# Patient Record
Sex: Female | Born: 1980 | Race: White | Hispanic: No | Marital: Married | Smoking: Never smoker
Health system: Southern US, Community
[De-identification: ages and names within clinical notes are randomized; demographics above are authoritative.]

## PROBLEM LIST (undated history)

## (undated) ENCOUNTER — Inpatient Hospital Stay (HOSPITAL_COMMUNITY): Payer: Self-pay

## (undated) ENCOUNTER — Inpatient Hospital Stay (HOSPITAL_COMMUNITY): Payer: PRIVATE HEALTH INSURANCE

## (undated) DIAGNOSIS — G4730 Sleep apnea, unspecified: Secondary | ICD-10-CM

## (undated) DIAGNOSIS — F3281 Premenstrual dysphoric disorder: Secondary | ICD-10-CM

## (undated) HISTORY — PX: OTHER SURGICAL HISTORY: SHX169

## (undated) HISTORY — PX: EYE SURGERY: SHX253

## (undated) HISTORY — DX: Premenstrual dysphoric disorder: F32.81

## (undated) HISTORY — PX: UTERINE FIBROID SURGERY: SHX826

---

## 2012-12-21 ENCOUNTER — Encounter (HOSPITAL_COMMUNITY): Payer: Self-pay | Admitting: Emergency Medicine

## 2012-12-21 ENCOUNTER — Emergency Department (HOSPITAL_COMMUNITY)
Admission: EM | Admit: 2012-12-21 | Discharge: 2012-12-21 | Disposition: A | Discharge status: Home / Self Care | Payer: PRIVATE HEALTH INSURANCE | Attending: Emergency Medicine | Admitting: Emergency Medicine

## 2012-12-21 ENCOUNTER — Emergency Department (HOSPITAL_COMMUNITY): Payer: PRIVATE HEALTH INSURANCE

## 2012-12-21 DIAGNOSIS — R079 Chest pain, unspecified: Secondary | ICD-10-CM | POA: Insufficient documentation

## 2012-12-21 DIAGNOSIS — Z79899 Other long term (current) drug therapy: Secondary | ICD-10-CM | POA: Insufficient documentation

## 2012-12-21 DIAGNOSIS — IMO0002 Reserved for concepts with insufficient information to code with codable children: Secondary | ICD-10-CM | POA: Insufficient documentation

## 2012-12-21 LAB — CBC WITH DIFFERENTIAL/PLATELET
Basophils Absolute: 0 10*3/uL (ref 0.0–0.1)
Basophils Relative: 0 % (ref 0–1)
Eosinophils Relative: 2 % (ref 0–5)
Lymphocytes Relative: 39 % (ref 12–46)
MCH: 29.3 pg (ref 26.0–34.0)
Monocytes Relative: 7 % (ref 3–12)
Neutro Abs: 5 10*3/uL (ref 1.7–7.7)
Platelets: 258 10*3/uL (ref 150–400)
RBC: 4.34 MIL/uL (ref 3.87–5.11)
RDW: 12.9 % (ref 11.5–15.5)
WBC: 9.6 10*3/uL (ref 4.0–10.5)

## 2012-12-21 LAB — BASIC METABOLIC PANEL
BUN: 10 mg/dL (ref 6–23)
CO2: 22 mEq/L (ref 19–32)
Calcium: 9.2 mg/dL (ref 8.4–10.5)
Chloride: 104 mEq/L (ref 96–112)
GFR calc Af Amer: >90 mL/min (ref >90)
GFR calc non Af Amer: >90 mL/min (ref >90)
Sodium: 136 mEq/L (ref 135–145)

## 2012-12-21 LAB — POCT I-STAT TROPONIN I: Troponin i, poc: 0 ng/mL (ref 0.00–0.08)

## 2012-12-21 LAB — D-DIMER, QUANTITATIVE: D-Dimer, Quant: 0.37 ug/mL-FEU (ref 0.00–0.48)

## 2012-12-21 NOTE — ED Provider Notes (Signed)
CSN: [id number]     Arrival date & time 12/21/12  2154 History   First MD Initiated Contact with Patient 12/21/12 2219     Chief Complaint  Patient presents with  . Chest Pain   (Consider location/radiation/quality/duration/timing/severity/associated sxs/prior Treatment) Patient is a 32 y.o. female presenting with chest pain. The history is provided by the patient (the pt states she had some chest pain ). No language interpreter was used.  Chest Pain Pain location:  L chest Pain quality: aching   Pain radiates to:  Does not radiate Pain radiates to the back: no   Pain severity:  Mild Onset quality:  Sudden Progression:  Waxing and waning Associated symptoms: no abdominal pain, no back pain, no cough, no fatigue and no headache     History reviewed. No pertinent past medical history. History reviewed. No pertinent past surgical history. No family history on file. History  Substance Use Topics  . Smoking status: Never Smoker   . Smokeless tobacco: Not on file  . Alcohol Use: Yes     Comment: occasionally   OB History   Grav Para Term Preterm Abortions TAB SAB Ect Mult Living                 Review of Systems  Constitutional: Negative for appetite change and fatigue.  HENT: Negative for congestion, ear discharge and sinus pressure.   Eyes: Negative for discharge.  Respiratory: Negative for cough.   Cardiovascular: Positive for chest pain.  Gastrointestinal: Negative for abdominal pain and diarrhea.  Genitourinary: Negative for frequency and hematuria.  Musculoskeletal: Negative for back pain.  Skin: Negative for rash.  Neurological: Negative for seizures and headaches.  Psychiatric/Behavioral: Negative for hallucinations.    Allergies  Review of patient's allergies indicates not on file.  Home Medications   Current Outpatient Rx  Name  Route  Sig  Dispense  Refill  . buPROPion (WELLBUTRIN XL) 300 MG 24 hr tablet   Oral   Take 300 mg by mouth daily.          . fluticasone (FLONASE) 50 MCG/ACT nasal spray                BP 129/76  Temp(Src) 97.9 F (36.6 C) (Oral)  Resp 27  SpO2 100%  LMP 12/18/2012 Physical Exam  Constitutional: She is oriented to person, place, and time. She appears well-developed.  HENT:  Head: Normocephalic.  Eyes: Conjunctivae and EOM are normal. No scleral icterus.  Neck: Neck supple. No thyromegaly present.  Cardiovascular: Normal rate and regular rhythm.  Exam reveals no gallop and no friction rub.   No murmur heard. Pulmonary/Chest: No stridor. She has no wheezes. She has no rales. She exhibits no tenderness.  Abdominal: She exhibits no distension. There is no tenderness. There is no rebound.  Musculoskeletal: Normal range of motion. She exhibits no edema.  Lymphadenopathy:    She has no cervical adenopathy.  Neurological: She is oriented to person, place, and time. She exhibits normal muscle tone. Coordination normal.  Skin: No rash noted. No erythema.  Psychiatric: She has a normal mood and affect. Her behavior is normal.    ED Course  Procedures (including critical care time) Labs Review Labs Reviewed  BASIC METABOLIC PANEL - Abnormal; Notable for the following:    Glucose, Bld 121 (*)    All other components within normal limits  CBC WITH DIFFERENTIAL  D-DIMER, QUANTITATIVE  POCT I-STAT TROPONIN I   Imaging Review Dg Chest 2 View  12/21/2012   CLINICAL DATA:  Chest pain.  EXAM: CHEST  2 VIEW  COMPARISON:  None.  FINDINGS: The heart size and mediastinal contours are within normal limits. Both lungs are clear. The visualized skeletal structures are unremarkable.  IMPRESSION: Normal examination.   Electronically Signed   By: Gordan Payment M.D.   On: 12/21/2012 22:55    EKG Interpretation     Ventricular Rate:  83 PR Interval:  149 QRS Duration: 82 QT Interval:  376 QTC Calculation: 442 R Axis:   60 Text Interpretation:  Age not entered, assumed to be  32 years old for purpose of ECG  interpretation Sinus rhythm Consider left atrial enlargement            MDM   1. Chest pain        Benny Lennert, MD 12/21/12 [phone]

## 2012-12-21 NOTE — ED Notes (Signed)
Pt states that she has had chest pain and shortness of breath of 2 days; pt was seen at the urgent care in [place] today and they called her this evening and advised to go to ER for evaluation of PE due to elevated blood work; pt is unsure what labs was elevated

## 2015-06-14 IMAGING — CR DG CHEST 2V
2 series · 2 of 2 positions shown · non-contrast
Comparison: None.

CLINICAL DATA: Chest pain.

EXAM:
CHEST  2 VIEW

[w chest pa]
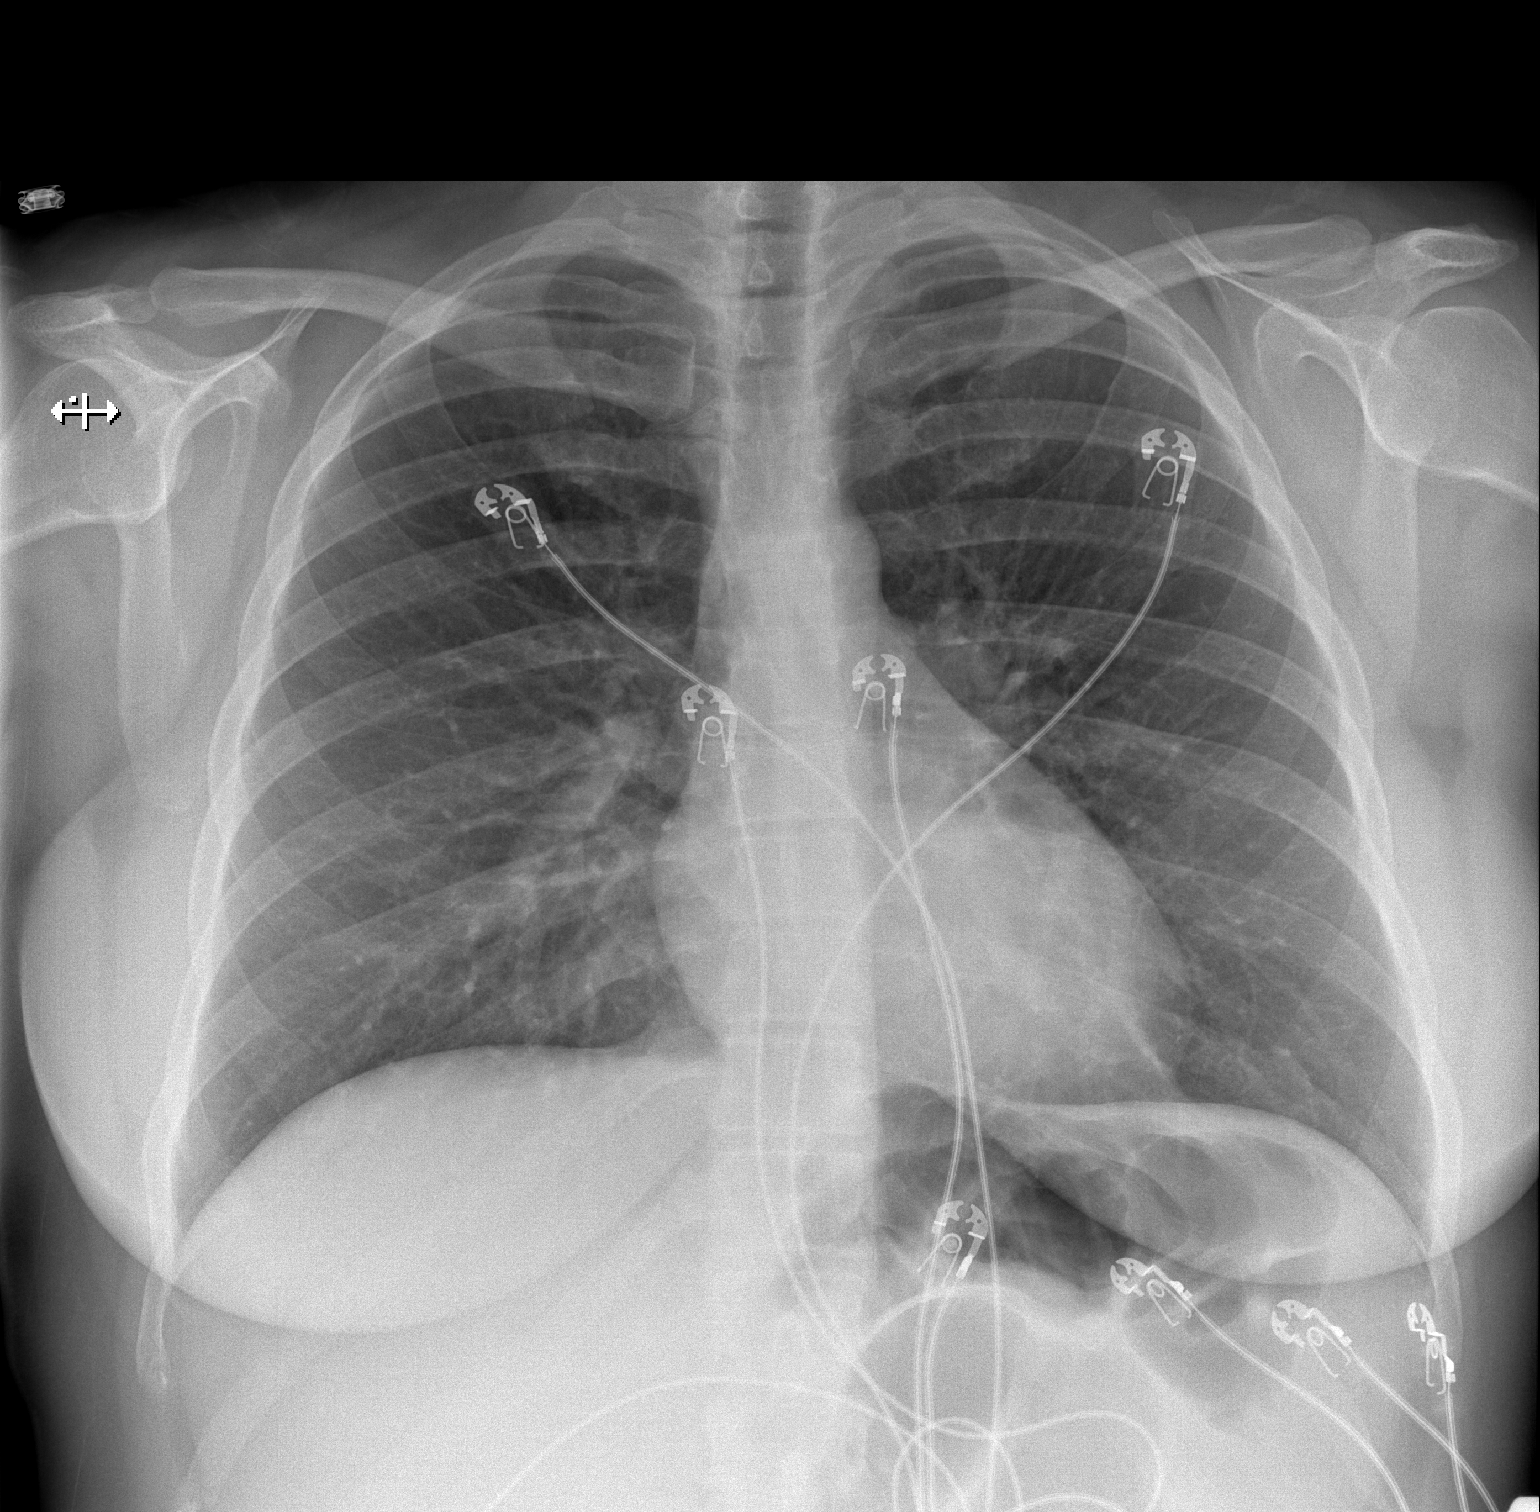

[w chest lat]
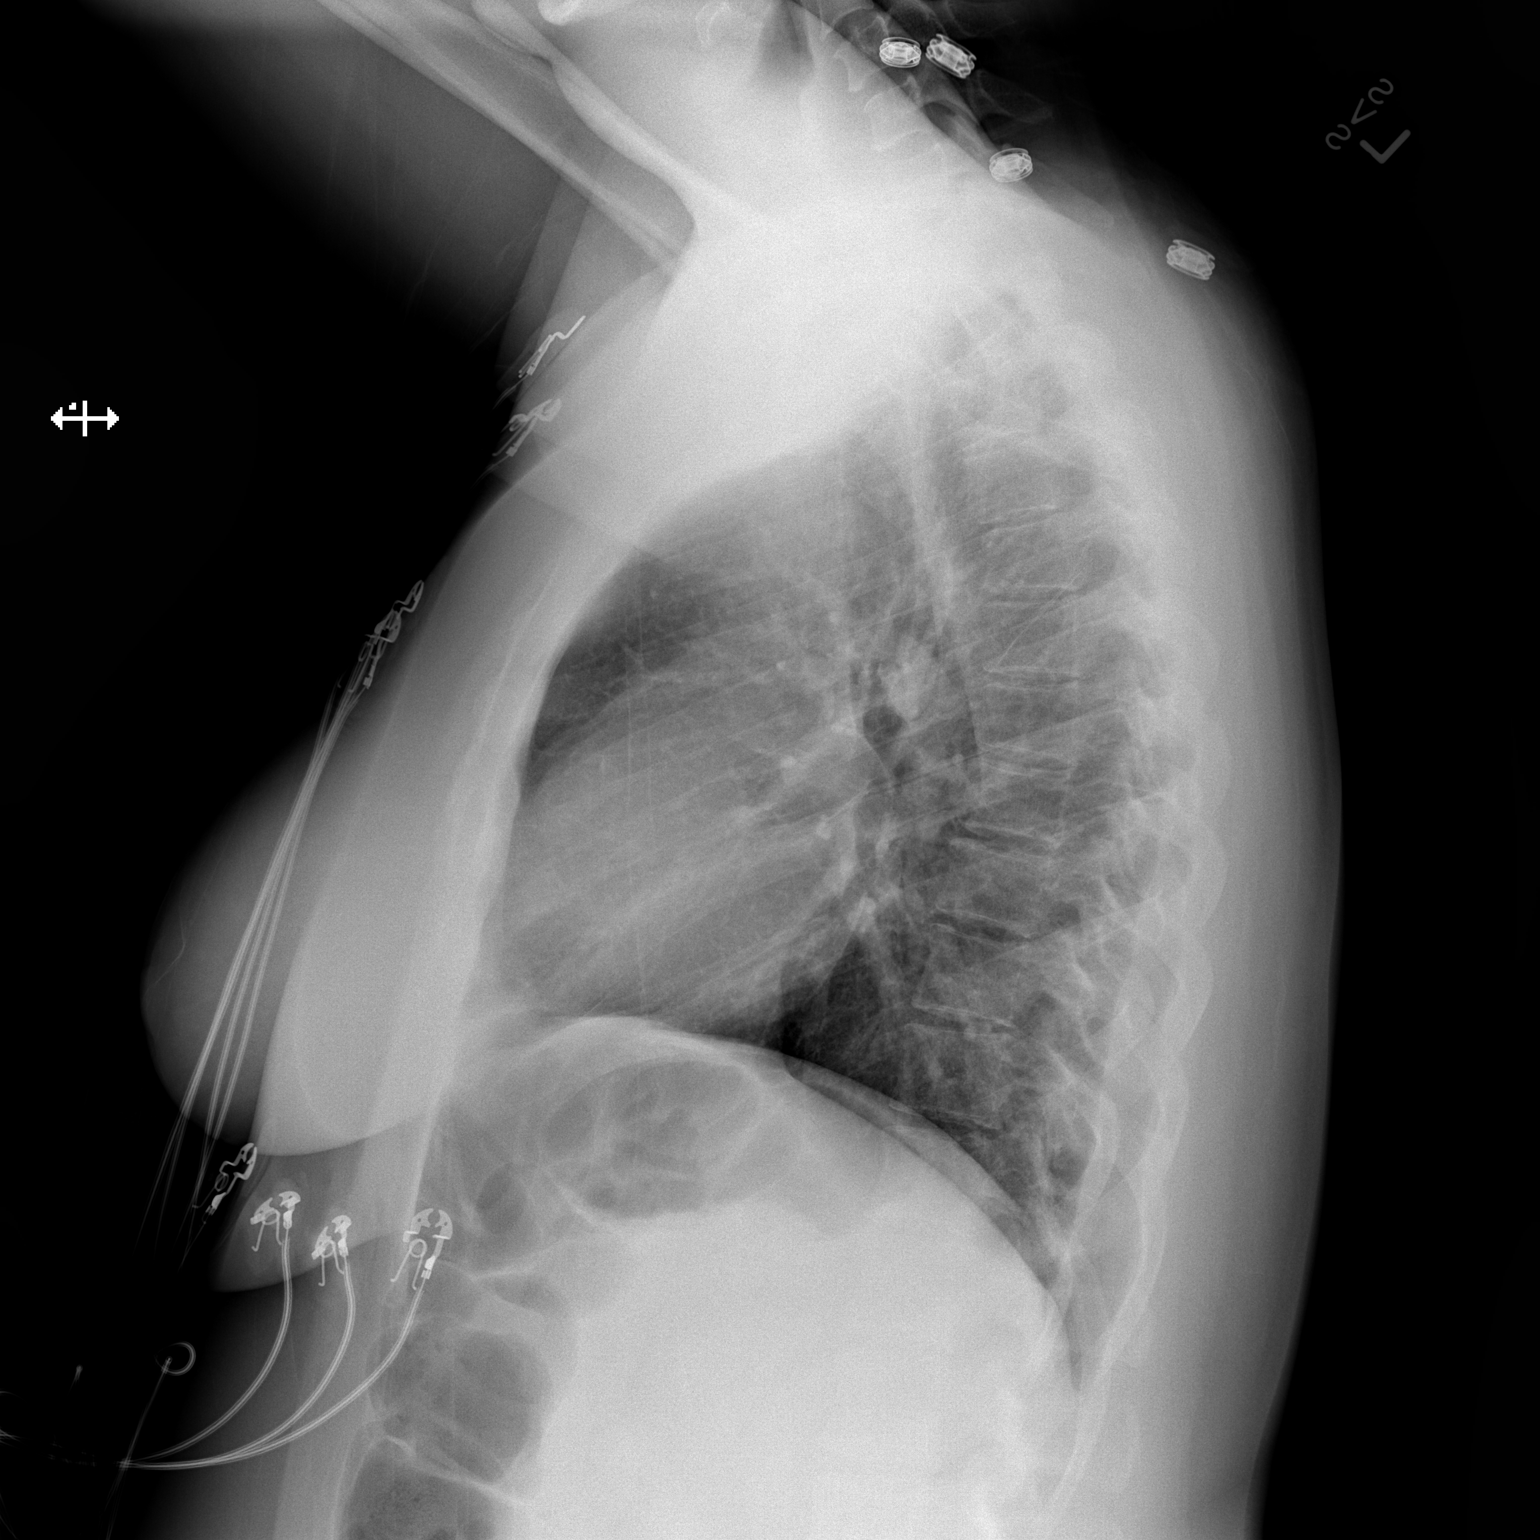

[2 of 2 positions shown; findings below may reference images not displayed]

FINDINGS: The heart size and mediastinal contours are within normal limits.
Both lungs are clear. The visualized skeletal structures are
unremarkable.
IMPRESSION: Normal examination.

## 2016-06-18 LAB — OB RESULTS CONSOLE HIV ANTIBODY (ROUTINE TESTING): HIV: NONREACTIVE

## 2016-06-18 LAB — OB RESULTS CONSOLE GC/CHLAMYDIA
Chlamydia: NEGATIVE
Gonorrhea: NEGATIVE

## 2016-06-18 LAB — OB RESULTS CONSOLE RPR: RPR: NONREACTIVE

## 2016-06-18 LAB — OB RESULTS CONSOLE HEPATITIS B SURFACE ANTIGEN: HEP B S AG: NEGATIVE

## 2016-06-18 LAB — OB RESULTS CONSOLE ABO/RH: RH TYPE: POSITIVE

## 2016-06-18 LAB — OB RESULTS CONSOLE RUBELLA ANTIBODY, IGM: Rubella: IMMUNE

## 2016-06-18 LAB — OB RESULTS CONSOLE ANTIBODY SCREEN: Antibody Screen: NEGATIVE

## 2016-11-13 ENCOUNTER — Encounter: Payer: PRIVATE HEALTH INSURANCE | Attending: Obstetrics and Gynecology | Admitting: Registered"

## 2016-11-13 DIAGNOSIS — R7309 Other abnormal glucose: Secondary | ICD-10-CM

## 2016-11-13 DIAGNOSIS — O99810 Abnormal glucose complicating pregnancy: Secondary | ICD-10-CM | POA: Insufficient documentation

## 2016-11-13 DIAGNOSIS — Z3A00 Weeks of gestation of pregnancy not specified: Secondary | ICD-10-CM | POA: Insufficient documentation

## 2016-11-14 NOTE — Progress Notes (Signed)
Patient was seen on 11/13/2016 for Gestational Diabetes self-management class at the [facility]. The following learning objectives were met by the patient during this course:   States the definition of Gestational Diabetes  States why dietary management is important in controlling blood glucose  Describes the effects each nutrient has on blood glucose levels  Demonstrates ability to create a balanced meal plan  Demonstrates carbohydrate counting   States when to check blood glucose levels  Demonstrates proper blood glucose monitoring techniques  States the effect of stress and exercise on blood glucose levels  States the importance of limiting caffeine and abstaining from alcohol and smoking  Blood glucose monitor given: Contour Next Lot # [id number] Exp: 11/10/2016 (date on box expired, but gave to pt because test strip box expiration is 02/10/2017)   Blood glucose reading: 91  Patient instructed to monitor glucose levels: FBS: 60 - <95 1 hour: <140 2 hour: <120  Patient received handouts:  Nutrition Diabetes and Pregnancy  Carbohydrate Counting List  Patient will be seen for follow-up as needed.

## 2016-11-18 ENCOUNTER — Encounter: Payer: Self-pay | Admitting: Registered"

## 2016-11-18 DIAGNOSIS — R7309 Other abnormal glucose: Secondary | ICD-10-CM | POA: Insufficient documentation

## 2016-11-27 LAB — OB RESULTS CONSOLE GBS: STREP GROUP B AG: POSITIVE

## 2016-12-20 ENCOUNTER — Encounter (HOSPITAL_COMMUNITY): Payer: Self-pay | Admitting: *Deleted

## 2016-12-20 ENCOUNTER — Telehealth (HOSPITAL_COMMUNITY): Payer: Self-pay | Admitting: *Deleted

## 2016-12-20 ENCOUNTER — Inpatient Hospital Stay (HOSPITAL_COMMUNITY)
Admission: AD | Admit: 2016-12-20 | Discharge: 2016-12-20 | Disposition: A | Discharge status: Home / Self Care | Payer: PRIVATE HEALTH INSURANCE | Source: Ambulatory Visit | Attending: Obstetrics and Gynecology | Admitting: Obstetrics and Gynecology

## 2016-12-20 ENCOUNTER — Encounter (HOSPITAL_COMMUNITY): Payer: Self-pay

## 2016-12-20 DIAGNOSIS — O133 Gestational [pregnancy-induced] hypertension without significant proteinuria, third trimester: Secondary | ICD-10-CM | POA: Insufficient documentation

## 2016-12-20 DIAGNOSIS — Z3A38 38 weeks gestation of pregnancy: Secondary | ICD-10-CM | POA: Diagnosis not present

## 2016-12-20 DIAGNOSIS — O139 Gestational [pregnancy-induced] hypertension without significant proteinuria, unspecified trimester: Secondary | ICD-10-CM | POA: Diagnosis present

## 2016-12-20 HISTORY — DX: Sleep apnea, unspecified: G47.30

## 2016-12-20 LAB — CBC
HCT: 33.1 % — ABNORMAL LOW (ref 36.0–46.0)
Hemoglobin: 11.4 g/dL — ABNORMAL LOW (ref 12.0–15.0)
MCH: 28.7 pg (ref 26.0–34.0)
MCHC: 34.4 g/dL (ref 30.0–36.0)
MCV: 83.4 fL (ref 78.0–100.0)
PLATELETS: 201 10*3/uL (ref 150–400)
RBC: 3.97 MIL/uL (ref 3.87–5.11)
RDW: 14.8 % (ref 11.5–15.5)
WBC: 9.6 10*3/uL (ref 4.0–10.5)

## 2016-12-20 LAB — PROTEIN / CREATININE RATIO, URINE
CREATININE, URINE: 54 mg/dL
PROTEIN CREATININE RATIO: 0.28 mg/mg{creat} — AB (ref 0.00–0.15)
TOTAL PROTEIN, URINE: 15 mg/dL

## 2016-12-20 LAB — URINALYSIS, ROUTINE W REFLEX MICROSCOPIC
BILIRUBIN URINE: NEGATIVE
Glucose, UA: NEGATIVE mg/dL
Hgb urine dipstick: NEGATIVE
Ketones, ur: NEGATIVE mg/dL
Leukocytes, UA: NEGATIVE
Nitrite: NEGATIVE
PH: 6 (ref 5.0–8.0)
Protein, ur: NEGATIVE mg/dL
SPECIFIC GRAVITY, URINE: 1.008 (ref 1.005–1.030)

## 2016-12-20 LAB — COMPREHENSIVE METABOLIC PANEL
ALT: 12 U/L — ABNORMAL LOW (ref 14–54)
ANION GAP: 8 (ref 5–15)
AST: 15 U/L (ref 15–41)
Albumin: 2.6 g/dL — ABNORMAL LOW (ref 3.5–5.0)
Alkaline Phosphatase: 150 U/L — ABNORMAL HIGH (ref 38–126)
BUN: 15 mg/dL (ref 6–20)
CHLORIDE: 108 mmol/L (ref 101–111)
CO2: 20 mmol/L — ABNORMAL LOW (ref 22–32)
Calcium: 8.5 mg/dL — ABNORMAL LOW (ref 8.9–10.3)
Creatinine, Ser: 0.77 mg/dL (ref 0.44–1.00)
Glucose, Bld: 76 mg/dL (ref 65–99)
POTASSIUM: 4 mmol/L (ref 3.5–5.1)
Sodium: 136 mmol/L (ref 135–145)
Total Bilirubin: 0.5 mg/dL (ref 0.3–1.2)
Total Protein: 5.6 g/dL — ABNORMAL LOW (ref 6.5–8.1)

## 2016-12-20 NOTE — Telephone Encounter (Signed)
Preadmission screen  

## 2016-12-20 NOTE — MAU Provider Note (Signed)
History     CSN: [id number]  Arrival date and time: 12/20/16 1527   First Provider Initiated Contact with Patient 12/20/16 1638      Chief Complaint  Patient presents with  . Hypertension   HPI  Ms. Thornton ParkKristen Chamberlain is a 36 y.o. G1P0 at 436w6d gestation sent from her OB office for PEC w/u. She had elevated BPs in the office this week -- normal labs in the office this week. NST in the office today.   Past Medical History:  Diagnosis Date  . PMDD (premenstrual dysphoric disorder)     Past Surgical History:  Procedure Laterality Date  . chin surgery    . EYE SURGERY    . UTERINE FIBROID SURGERY      Family History  Problem Relation Age of Onset  . Cancer Mother   . Hypertension Brother   . Diabetes Maternal Grandmother   . Hypertension Maternal Grandfather     Social History   Tobacco Use  . Smoking status: Never Smoker  . Smokeless tobacco: Never Used  Substance Use Topics  . Alcohol use: No    Frequency: Never    Comment: occasionally  . Drug use: No    Allergies:  Allergies  Allergen Reactions  . Sulfa Antibiotics Swelling    Of the lips    Medications Prior to Admission  Medication Sig Dispense Refill Last Dose  . acetaminophen (TYLENOL) 500 MG tablet Take 500 mg every 6 (six) hours as needed by mouth for moderate pain.   12/19/2016 at Unknown time  . buPROPion (WELLBUTRIN XL) 300 MG 24 hr tablet Take 300 mg by mouth daily.   12/20/2016 at Unknown time  . calcium carbonate (TUMS - DOSED IN MG ELEMENTAL CALCIUM) 500 MG chewable tablet Chew 2 tablets 3 (three) times daily as needed by mouth for indigestion or heartburn.   12/20/2016 at Unknown time  . calcium-vitamin D (CALCIUM 500/D) 500-200 MG-UNIT tablet Take 1 tablet daily with breakfast by mouth. Pt stopped taking two weeks ago. 12/06/16   12/06/2016  . fluticasone (FLONASE) 50 MCG/ACT nasal spray Place 2 sprays daily into both nostrils.    12/20/2016 at Unknown time  . Magnesium 400 MG CAPS Take 1  capsule daily by mouth. Pt stopped taking two weeks ago. 12/06/16   12/06/2016  . Prenatal Vit-Fe Fumarate-FA (PRENATAL MULTIVITAMIN) TABS tablet Take 1 tablet at bedtime by mouth.   12/19/2016    Review of Systems  Constitutional: Negative.   HENT: Negative.   Eyes: Negative.   Respiratory: Negative.   Cardiovascular: Positive for leg swelling (bilateral -- not new issue).  Gastrointestinal: Negative.   Endocrine: Negative.   Genitourinary: Negative.   Musculoskeletal: Negative.   Skin: Negative.   Allergic/Immunologic: Negative.   Neurological: Negative.   Hematological: Negative.   Psychiatric/Behavioral: Negative.    Physical Exam   Blood pressure (!) 134/91, pulse 68, temperature 98.4 F (36.9 C), temperature source Oral, resp. rate 20.   Patient Vitals for the past 24 hrs:  BP Temp Temp src Pulse Resp  12/20/16 1631 (!) 134/91 - - 68 -  12/20/16 1616 136/89 - - 79 -  12/20/16 1601 (!) 143/92 - - 85 -  12/20/16 1550 (!) 143/64 98.4 F (36.9 C) Oral 93 20    Physical Exam  Nursing note and vitals reviewed. Constitutional: She is oriented to person, place, and time. She appears well-developed and well-nourished.  HENT:  Head: Normocephalic.  Eyes: Pupils are equal, round, and reactive to  light.  Neck: Normal range of motion.  Cardiovascular: Normal rate, regular rhythm, normal heart sounds and intact distal pulses.  Respiratory: Effort normal and breath sounds normal.  GI: Soft. Bowel sounds are normal.  Genitourinary:  Genitourinary Comments: Pelvic deferred  Musculoskeletal: Normal range of motion.  Neurological: She is alert and oriented to person, place, and time. She has normal reflexes.  Skin: Skin is warm and dry.  Psychiatric: She has a normal mood and affect. Her behavior is normal. Judgment and thought content normal.    MAU Course  Procedures  MDM CCUA CBC CMP P/C ratio NST - FHR: 130 bpm / moderate variability / accels present / decels absent /  TOCO: none  *Consult with Dr. Jackelyn KnifeMeisinger @ 1745 - notified of patient's complaints, assessments, lab & U/S results, recommended tx plan d/c home with no antihypertensive med and f/u in office as scheduled   Assessment and Plan  Gestational hypertension, third trimester - Discharge home with information on gHTN and PEC - S/S reviewed to return to MAU or OB office - Advised to stay well-hydrated, rest with BLE elevated and rest for the weekend  Patient verbalized an understanding of the plan of care and agrees.   Raelyn Moraolitta Jamiaya Bina, MSN, CNM 12/20/2016, 4:44 PM

## 2016-12-20 NOTE — MAU Note (Signed)
Pt reports Dr Jackelyn KnifeMeisinger sent her over from the office due to elevated BP. Reports it has been elevated the past couple of days. No headache, no blurry vision. No vaginal bleeding, no LOF. +fetal movement

## 2016-12-24 MED ORDER — HYDRALAZINE HCL 20 MG/ML IJ SOLN
10.0000 mg | Freq: Once | INTRAMUSCULAR | Status: DC | PRN
  Filled 2016-12-24: qty 0.5

## 2016-12-24 MED ORDER — LABETALOL HCL 5 MG/ML IV SOLN
20.0000 mg | INTRAVENOUS | Status: DC | PRN
  Administered 2016-12-26: 20 mg via INTRAVENOUS
  Filled 2016-12-24: qty 16
  Filled 2016-12-24: qty 4

## 2016-12-24 NOTE — H&P (Signed)
Brittany ParkKristen Werner is a 36 y.o. female G1P0 at 2739 4/7 weeks (EDD 12/28/16 by known date of conception with a Day 5 transfer of embryo from donor agg and sperm)  presenting for IOL for gestational hypertension with increasing BP since about [redacted] weeks gestation.  No symptoms, lab abnormalities, or proteinuria.  She is GBS positive. Prenatal care also complicated by gestational diabetes--diet controlled and diagnosed at 33 weeks on repeat 3 hour GTT.  She is AMA but had donor egg and sperm for embryo that was pretested and 46XX.  She has a history of depressive disorder but is stable on Wellbutrin.   OB History    Gravida Para Term Preterm AB Living   1             SAB TAB Ectopic Multiple Live Births                 Past Medical History:  Diagnosis Date  . PMDD (premenstrual dysphoric disorder)   . Sleep apnea    Past Surgical History:  Procedure Laterality Date  . chin surgery    . EYE SURGERY    . UTERINE FIBROID SURGERY     Family History: family history includes Cancer in her mother; Diabetes in her maternal grandmother; Hypertension in her brother and maternal grandfather. Social History:  reports that  has never smoked. she has never used smokeless tobacco. She reports that she does not drink alcohol or use drugs.     Maternal Diabetes: Yes:  Diabetes Type:  Diet controlled Genetic Screening: Normal Maternal Ultrasounds/Referrals: Normal Fetal Ultrasounds or other Referrals:  None Maternal Substance Abuse:  No Significant Maternal Medications:  Meds include: Other:  wellbutrin Significant Maternal Lab Results:  None Other Comments:  None  Review of Systems  Gastrointestinal: Negative for abdominal pain.  Neurological: Negative for headaches.   Maternal Medical History:  Contractions: Frequency: irregular.   Perceived severity is mild.    Fetal activity: Perceived fetal activity is normal.    Prenatal complications: PIH.   Prenatal Complications - Diabetes:  gestational. Diabetes is managed by diet.        There were no vitals taken for this visit. Maternal Exam:  Uterine Assessment: Contraction strength is mild.  Contraction frequency is irregular.   Abdomen: Patient reports no abdominal tenderness. Fetal presentation: vertex  Introitus: Normal vulva. Normal vagina.    Physical Exam  Cardiovascular: Normal rate and regular rhythm.  Respiratory: Effort normal.  GI: Soft.  Genitourinary: Vagina normal.  Musculoskeletal: She exhibits edema.  Neurological: She is alert.  Psychiatric: She has a normal mood and affect.    Prenatal labs: ABO, Rh: O/Positive/-- (05/08 0000) Antibody: Negative (05/08 0000) Rubella: Immune (05/08 0000) RPR: Nonreactive (05/08 0000)  HBsAg: Negative (05/08 0000)  HIV: Non-reactive (05/08 0000)  GBS: Positive (10/17 0000)  One hour GCT 138/3 hour  GTT abnormal at 32 weeks Normal genetics on embryo prior to transfer  Assessment/Plan: Pt is admitted for IOL for gestational hypertension.  Labs 4 days ago were WNL and will be repeated on admission.  Plan cytotec ripening and then AROM and pitocin when able.  Treat BP as needed with protocol.  Consider magnesium only if severe parameters develop. PCN for +GBS.     Oliver PilaKathy Werner Brittany Werner 12/24/2016, 1:33 PM

## 2016-12-25 ENCOUNTER — Inpatient Hospital Stay (HOSPITAL_COMMUNITY)
Admission: RE | Admit: 2016-12-25 | Discharge: 2016-12-29 | DRG: 788 | Disposition: A | Discharge status: Home / Self Care | Payer: PRIVATE HEALTH INSURANCE | Source: Ambulatory Visit | Attending: Obstetrics and Gynecology | Admitting: Obstetrics and Gynecology

## 2016-12-25 ENCOUNTER — Encounter (HOSPITAL_COMMUNITY): Payer: Self-pay | Admitting: Anesthesiology

## 2016-12-25 ENCOUNTER — Inpatient Hospital Stay (HOSPITAL_COMMUNITY): Payer: PRIVATE HEALTH INSURANCE | Admitting: Anesthesiology

## 2016-12-25 ENCOUNTER — Encounter (HOSPITAL_COMMUNITY): Payer: Self-pay

## 2016-12-25 ENCOUNTER — Other Ambulatory Visit: Payer: Self-pay

## 2016-12-25 DIAGNOSIS — F329 Major depressive disorder, single episode, unspecified: Secondary | ICD-10-CM | POA: Diagnosis present

## 2016-12-25 DIAGNOSIS — O99824 Streptococcus B carrier state complicating childbirth: Secondary | ICD-10-CM | POA: Diagnosis present

## 2016-12-25 DIAGNOSIS — O99214 Obesity complicating childbirth: Secondary | ICD-10-CM | POA: Diagnosis present

## 2016-12-25 DIAGNOSIS — O24420 Gestational diabetes mellitus in childbirth, diet controlled: Secondary | ICD-10-CM | POA: Diagnosis present

## 2016-12-25 DIAGNOSIS — Z3A39 39 weeks gestation of pregnancy: Secondary | ICD-10-CM | POA: Diagnosis not present

## 2016-12-25 DIAGNOSIS — O133 Gestational [pregnancy-induced] hypertension without significant proteinuria, third trimester: Secondary | ICD-10-CM | POA: Diagnosis present

## 2016-12-25 DIAGNOSIS — O99344 Other mental disorders complicating childbirth: Secondary | ICD-10-CM | POA: Diagnosis present

## 2016-12-25 DIAGNOSIS — O134 Gestational [pregnancy-induced] hypertension without significant proteinuria, complicating childbirth: Principal | ICD-10-CM | POA: Diagnosis present

## 2016-12-25 LAB — CBC
HCT: 34.3 % — ABNORMAL LOW (ref 36.0–46.0)
HCT: 33.7 % — ABNORMAL LOW (ref 36.0–46.0)
HCT: 36.4 % (ref 36.0–46.0)
Hemoglobin: 11.6 g/dL — ABNORMAL LOW (ref 12.0–15.0)
Hemoglobin: 11.2 g/dL — ABNORMAL LOW (ref 12.0–15.0)
Hemoglobin: 12 g/dL (ref 12.0–15.0)
MCH: 28.9 pg (ref 26.0–34.0)
MCH: 28.3 pg (ref 26.0–34.0)
MCH: 28.6 pg (ref 26.0–34.0)
MCHC: 33.8 g/dL (ref 30.0–36.0)
MCHC: 33.2 g/dL (ref 30.0–36.0)
MCHC: 33 g/dL (ref 30.0–36.0)
MCV: 85.3 fL (ref 78.0–100.0)
MCV: 85.1 fL (ref 78.0–100.0)
MCV: 86.9 fL (ref 78.0–100.0)
PLATELETS: 249 10*3/uL (ref 150–400)
PLATELETS: 263 10*3/uL (ref 150–400)
PLATELETS: 235 10*3/uL (ref 150–400)
RBC: 4.02 MIL/uL (ref 3.87–5.11)
RBC: 3.96 MIL/uL (ref 3.87–5.11)
RBC: 4.19 MIL/uL (ref 3.87–5.11)
RDW: 14.6 % (ref 11.5–15.5)
RDW: 14.8 % (ref 11.5–15.5)
RDW: 14.6 % (ref 11.5–15.5)
WBC: 10.2 10*3/uL (ref 4.0–10.5)
WBC: 10.1 10*3/uL (ref 4.0–10.5)
WBC: 12.8 10*3/uL — ABNORMAL HIGH (ref 4.0–10.5)

## 2016-12-25 LAB — PROTEIN / CREATININE RATIO, URINE
CREATININE, URINE: 239 mg/dL
Protein Creatinine Ratio: 0.24 mg/mg{Cre} — ABNORMAL HIGH (ref 0.00–0.15)
Total Protein, Urine: 58 mg/dL

## 2016-12-25 LAB — COMPREHENSIVE METABOLIC PANEL
ALBUMIN: 2.7 g/dL — AB (ref 3.5–5.0)
ALK PHOS: 160 U/L — AB (ref 38–126)
ALT: 12 U/L — ABNORMAL LOW (ref 14–54)
ANION GAP: 7 (ref 5–15)
AST: 14 U/L — ABNORMAL LOW (ref 15–41)
BILIRUBIN TOTAL: 0.3 mg/dL (ref 0.3–1.2)
BUN: 21 mg/dL — AB (ref 6–20)
CALCIUM: 8.8 mg/dL — AB (ref 8.9–10.3)
CO2: 20 mmol/L — AB (ref 22–32)
CREATININE: 0.79 mg/dL (ref 0.44–1.00)
Chloride: 109 mmol/L (ref 101–111)
GFR calc Af Amer: >60 mL/min (ref >60)
GFR calc non Af Amer: >60 mL/min (ref >60)
GLUCOSE: 103 mg/dL — AB (ref 65–99)
Potassium: 3.9 mmol/L (ref 3.5–5.1)
SODIUM: 136 mmol/L (ref 135–145)
TOTAL PROTEIN: 5.5 g/dL — AB (ref 6.5–8.1)

## 2016-12-25 LAB — TYPE AND SCREEN
ABO/RH(D): O POS
Antibody Screen: NEGATIVE

## 2016-12-25 LAB — ABO/RH: ABO/RH(D): O POS

## 2016-12-25 LAB — RPR: RPR: NONREACTIVE

## 2016-12-25 MED ORDER — PHENYLEPHRINE 40 MCG/ML (10ML) SYRINGE FOR IV PUSH (FOR BLOOD PRESSURE SUPPORT)
80.0000 ug | PREFILLED_SYRINGE | INTRAVENOUS | Status: DC | PRN
  Filled 2016-12-25: qty 10

## 2016-12-25 MED ORDER — LACTATED RINGERS IV SOLN: 500.0000 mL | INTRAVENOUS | Status: DC | PRN

## 2016-12-25 MED ORDER — OXYTOCIN 40 UNITS IN LACTATED RINGERS INFUSION - SIMPLE MED
1.0000 m[IU]/min | INTRAVENOUS | Status: DC
  Administered 2016-12-25: 2 m[IU]/min via INTRAVENOUS
  Administered 2016-12-26: 12 m[IU]/min via INTRAVENOUS
  Administered 2016-12-26: 14 m[IU]/min via INTRAVENOUS
  Administered 2016-12-26: 4 m[IU]/min via INTRAVENOUS
  Administered 2016-12-26: 8 m[IU]/min via INTRAVENOUS
  Administered 2016-12-26: 2 m[IU]/min via INTRAVENOUS
  Administered 2016-12-26: 30 m[IU]/min via INTRAVENOUS
  Administered 2016-12-26: 22 m[IU]/min via INTRAVENOUS
  Filled 2016-12-25 (×2): qty 1000

## 2016-12-25 MED ORDER — FENTANYL 2.5 MCG/ML BUPIVACAINE 1/10 % EPIDURAL INFUSION (WH - ANES)
14.0000 mL/h | INTRAMUSCULAR | Status: DC | PRN
Start: 2016-12-25 — End: 2016-12-26
  Administered 2016-12-25 – 2016-12-26 (×4): 14 mL/h via EPIDURAL
  Filled 2016-12-25 (×4): qty 100

## 2016-12-25 MED ORDER — OXYCODONE-ACETAMINOPHEN 5-325 MG PO TABS: 1.0000 | ORAL_TABLET | ORAL | Status: DC | PRN

## 2016-12-25 MED ORDER — MISOPROSTOL 25 MCG QUARTER TABLET
25.0000 ug | ORAL_TABLET | ORAL | Status: AC | PRN
  Administered 2016-12-25 (×2): 25 ug via VAGINAL
  Filled 2016-12-25 (×2): qty 1

## 2016-12-25 MED ORDER — ACETAMINOPHEN 325 MG PO TABS
650.0000 mg | ORAL_TABLET | ORAL | Status: DC | PRN
Start: 2016-12-25 — End: 2016-12-26

## 2016-12-25 MED ORDER — LIDOCAINE HCL (PF) 1 % IJ SOLN: 30.0000 mL | INTRAMUSCULAR | Status: DC | PRN

## 2016-12-25 MED ORDER — ONDANSETRON HCL 4 MG/2ML IJ SOLN: 4.0000 mg | Freq: Four times a day (QID) | INTRAMUSCULAR | Status: DC | PRN

## 2016-12-25 MED ORDER — TERBUTALINE SULFATE 1 MG/ML IJ SOLN: 0.2500 mg | Freq: Once | INTRAMUSCULAR | Status: DC | PRN

## 2016-12-25 MED ORDER — EPHEDRINE 5 MG/ML INJ: 10.0000 mg | INTRAVENOUS | Status: DC | PRN

## 2016-12-25 MED ORDER — DIPHENHYDRAMINE HCL 50 MG/ML IJ SOLN: 12.5000 mg | INTRAMUSCULAR | Status: DC | PRN

## 2016-12-25 MED ORDER — LIDOCAINE HCL (PF) 1 % IJ SOLN
INTRAMUSCULAR | Status: DC | PRN
  Administered 2016-12-25: 7 mL via EPIDURAL
  Administered 2016-12-25: 5 mL via EPIDURAL

## 2016-12-25 MED ORDER — PHENYLEPHRINE 40 MCG/ML (10ML) SYRINGE FOR IV PUSH (FOR BLOOD PRESSURE SUPPORT): 80.0000 ug | PREFILLED_SYRINGE | INTRAVENOUS | Status: DC | PRN

## 2016-12-25 MED ORDER — SOD CITRATE-CITRIC ACID 500-334 MG/5ML PO SOLN
30.0000 mL | ORAL | Status: DC | PRN
  Administered 2016-12-25 – 2016-12-26 (×2): 30 mL via ORAL
  Filled 2016-12-25 (×2): qty 15

## 2016-12-25 MED ORDER — OXYTOCIN 40 UNITS IN LACTATED RINGERS INFUSION - SIMPLE MED: 2.5000 [IU]/h | INTRAVENOUS | Status: DC

## 2016-12-25 MED ORDER — OXYTOCIN BOLUS FROM INFUSION: 500.0000 mL | Freq: Once | INTRAVENOUS | Status: DC

## 2016-12-25 MED ORDER — PENICILLIN G POT IN DEXTROSE 60000 UNIT/ML IV SOLN
3.0000 10*6.[IU] | INTRAVENOUS | Status: DC
  Administered 2016-12-25 – 2016-12-26 (×8): 3 10*6.[IU] via INTRAVENOUS
  Filled 2016-12-25 (×12): qty 50

## 2016-12-25 MED ORDER — LACTATED RINGERS IV SOLN: 500.0000 mL | Freq: Once | INTRAVENOUS | Status: DC

## 2016-12-25 MED ORDER — OXYCODONE-ACETAMINOPHEN 5-325 MG PO TABS: 2.0000 | ORAL_TABLET | ORAL | Status: DC | PRN

## 2016-12-25 MED ORDER — LACTATED RINGERS IV SOLN
INTRAVENOUS | Status: DC
  Administered 2016-12-25 – 2016-12-26 (×4): via INTRAVENOUS

## 2016-12-25 MED ORDER — BUTORPHANOL TARTRATE 1 MG/ML IJ SOLN
1.0000 mg | INTRAMUSCULAR | Status: DC | PRN
  Administered 2016-12-25 (×2): 1 mg via INTRAVENOUS
  Filled 2016-12-25 (×2): qty 1

## 2016-12-25 MED ORDER — PENICILLIN G POTASSIUM 5000000 UNITS IJ SOLR
5.0000 10*6.[IU] | Freq: Once | INTRAVENOUS | Status: AC
  Administered 2016-12-25: 5 10*6.[IU] via INTRAVENOUS
  Filled 2016-12-25: qty 5

## 2016-12-25 NOTE — Progress Notes (Signed)
Patient ID: Brittany ParkKristen Barrientez, female   DOB: Feb 09, 1981, 36 y.o.   MRN: [id number] Pt feeling mild pain only with contractions  afeb vss FHR category 1  Cervix 70/2/-2  Continuing pitocin and keep increasing

## 2016-12-25 NOTE — Progress Notes (Signed)
Patient ID: Brittany Werner, female   DOB: 1980-05-02, 36 y.o.   MRN: [id number] Pt admitted at midnight and now on second dose of cytotec placed at 640am, mild cramps  BP stable  130-150/83-90 FHR category 1  Cervix 1+/50/-2 On check at 0800am  Will recheck prior to pitocin and see if AROM possible

## 2016-12-25 NOTE — Progress Notes (Signed)
Patient ID: [name], female   DOB: 1980-04-20, 36 y.o.   MRN: [id number] Pt got uncomfortable and received stadol which helped  Cervix 70/1-2/-2  AROM clear  Will increase pitocin and follow progress, currently at 4 mu

## 2016-12-25 NOTE — Anesthesia Preprocedure Evaluation (Signed)
Anesthesia Evaluation  Patient identified by MRN, date of birth, ID band Patient awake    Reviewed: Allergy & Precautions, H&P , NPO status , Patient's Chart, lab work & pertinent test results  Airway Mallampati: II  TM Distance: >3 FB Neck ROM: full    Dental no notable dental hx. (+) Teeth Intact   Pulmonary    Pulmonary exam normal breath sounds clear to auscultation       Cardiovascular Normal cardiovascular exam Rhythm:regular Rate:Normal     Neuro/Psych negative neurological ROS     GI/Hepatic negative GI ROS, Neg liver ROS,   Endo/Other  Morbid obesity  Renal/GU negative Renal ROS     Musculoskeletal negative musculoskeletal ROS (+)   Abdominal (+) + obese,   Peds  Hematology negative hematology ROS (+)   Anesthesia Other Findings   Reproductive/Obstetrics (+) Pregnancy                             Anesthesia Physical Anesthesia Plan  ASA: III  Anesthesia Plan: Epidural   Post-op Pain Management:    Induction:   PONV Risk Score and Plan:   Airway Management Planned:   Additional Equipment:   Intra-op Plan:   Post-operative Plan:   Informed Consent: I have reviewed the patients History and Physical, chart, labs and discussed the procedure including the risks, benefits and alternatives for the proposed anesthesia with the patient or authorized representative who has indicated his/her understanding and acceptance.     Plan Discussed with:   Anesthesia Plan Comments:         Anesthesia Quick Evaluation

## 2016-12-25 NOTE — Progress Notes (Signed)
Patient ID: Brittany Werner, female   DOB: 14-May-1980, 36 y.o.   MRN: [id number] Pt doing ok, uncomfortable and received a 2nd stadol dose, but did not like the way it made her feel.  Had some anxiety after that and just now feeling better.  BP stable 140- 150/90's FHR category 1  Cervix 2/70/-2 at 2000pm  Pt does not tolerate exams well.  D/w her trying a foley bulb in cervix and she does not feel she can handle that.  Will keep going with pitocin. D/w her latent phase labor.  May get epidural soon.

## 2016-12-25 NOTE — Anesthesia Procedure Notes (Signed)
Epidural Patient location during procedure: OB Start time: 12/25/2016 9:02 PM End time: 12/25/2016 9:06 PM  Staffing Anesthesiologist: Leilani AbleHatchett, Isela Stantz, MD Performed: anesthesiologist   Preanesthetic Checklist Completed: patient identified, site marked, surgical consent, pre-op evaluation, timeout performed, IV checked, risks and benefits discussed and monitors and equipment checked  Epidural Patient position: sitting Prep: site prepped and draped and DuraPrep Patient monitoring: continuous pulse ox and blood pressure Approach: midline Location: L3-L4 Injection technique: LOR air  Needle:  Needle type: Tuohy  Needle gauge: 17 G Needle length: 9 cm and 9 Needle insertion depth: 7 cm Catheter type: closed end flexible Catheter size: 19 Gauge Catheter at skin depth: 12 cm Test dose: negative and Other  Assessment Sensory level: T9 Events: blood not aspirated, injection not painful, no injection resistance, negative IV test and no paresthesia  Additional Notes Reason for block:procedure for pain

## 2016-12-25 NOTE — Progress Notes (Signed)
Patient ID: Brittany Werner, female   DOB: 02/05/1981, 36 y.o.   MRN: [id number] Pt received epidural and more comfortable  afeb BP stable 120-130/80's  Cervix 70/3+/-1  IUPC placed to assess contractions and adjust pitocin.  Currently at 18 mu. FHR category 1

## 2016-12-25 NOTE — Anesthesia Pain Management Evaluation Note (Signed)
  CRNA Pain Management Visit Note  Patient: Brittany ParkKristen Godinho, 36 y.o., female  "Hello I am a member of the anesthesia team at [facility]. We have an anesthesia team available at all times to provide care throughout the hospital, including epidural management and anesthesia for C-section. I don't know your plan for the delivery whether it a natural birth, water birth, IV sedation, nitrous supplementation, doula or epidural, but we want to meet your pain goals."   1.Was your pain managed to your expectations on prior hospitalizations?   No prior hospitalizations  2.What is your expectation for pain management during this hospitalization?     Epidural  3.How can we help you reach that goal? unsure  Record the patient's initial score and the patient's pain goal.   Pain: 3  Pain Goal: 6 [facility] wants you to be able to say your pain was always managed very well.  Cephus ShellingBURGER,Sherian Valenza 12/25/2016

## 2016-12-26 ENCOUNTER — Encounter (HOSPITAL_COMMUNITY)
Admission: RE | Disposition: A | Discharge status: Home / Self Care | Payer: Self-pay | Source: Ambulatory Visit | Attending: Obstetrics and Gynecology

## 2016-12-26 ENCOUNTER — Encounter (HOSPITAL_COMMUNITY): Payer: Self-pay

## 2016-12-26 LAB — CBC
HEMATOCRIT: 32.2 % — AB (ref 36.0–46.0)
HEMOGLOBIN: 10.5 g/dL — AB (ref 12.0–15.0)
MCH: 28.7 pg (ref 26.0–34.0)
MCHC: 32.6 g/dL (ref 30.0–36.0)
MCV: 88 fL (ref 78.0–100.0)
Platelets: 223 10*3/uL (ref 150–400)
RBC: 3.66 MIL/uL — ABNORMAL LOW (ref 3.87–5.11)
RDW: 14.7 % (ref 11.5–15.5)
WBC: 19.5 10*3/uL — ABNORMAL HIGH (ref 4.0–10.5)

## 2016-12-26 SURGERY — Surgical Case
Anesthesia: Epidural

## 2016-12-26 MED ORDER — SCOPOLAMINE 1 MG/3DAYS TD PT72: 1.0000 | MEDICATED_PATCH | Freq: Once | TRANSDERMAL | Status: DC

## 2016-12-26 MED ORDER — NALBUPHINE HCL 10 MG/ML IJ SOLN: 5.0000 mg | Freq: Once | INTRAMUSCULAR | Status: DC | PRN

## 2016-12-26 MED ORDER — COCONUT OIL OIL: 1.0000 "application " | TOPICAL_OIL | Status: DC | PRN

## 2016-12-26 MED ORDER — NALOXONE HCL 0.4 MG/ML IJ SOLN: 0.4000 mg | INTRAMUSCULAR | Status: DC | PRN

## 2016-12-26 MED ORDER — DIPHENHYDRAMINE HCL 25 MG PO CAPS
25.0000 mg | ORAL_CAPSULE | ORAL | Status: DC | PRN
  Filled 2016-12-26: qty 1

## 2016-12-26 MED ORDER — FENTANYL CITRATE (PF) 100 MCG/2ML IJ SOLN
25.0000 ug | INTRAMUSCULAR | Status: DC | PRN
  Administered 2016-12-26 (×2): 25 ug via INTRAVENOUS

## 2016-12-26 MED ORDER — OXYCODONE HCL 5 MG PO TABS
10.0000 mg | ORAL_TABLET | ORAL | Status: DC | PRN
Start: 2016-12-26 — End: 2016-12-29

## 2016-12-26 MED ORDER — SODIUM CHLORIDE 0.9 % IR SOLN
Status: DC | PRN
  Administered 2016-12-26: 675 mL

## 2016-12-26 MED ORDER — ACETAMINOPHEN 325 MG PO TABS
650.0000 mg | ORAL_TABLET | ORAL | Status: DC | PRN
  Administered 2016-12-27 – 2016-12-29 (×4): 650 mg via ORAL
  Filled 2016-12-26 (×4): qty 2

## 2016-12-26 MED ORDER — ACETAMINOPHEN 500 MG PO TABS
1000.0000 mg | ORAL_TABLET | Freq: Four times a day (QID) | ORAL | Status: AC
  Administered 2016-12-26 – 2016-12-27 (×3): 1000 mg via ORAL
  Filled 2016-12-26 (×3): qty 2

## 2016-12-26 MED ORDER — SENNOSIDES-DOCUSATE SODIUM 8.6-50 MG PO TABS
2.0000 | ORAL_TABLET | ORAL | Status: DC
Start: 2016-12-27 — End: 2016-12-29
  Administered 2016-12-26 – 2016-12-28 (×3): 2 via ORAL
  Filled 2016-12-26 (×3): qty 2

## 2016-12-26 MED ORDER — MENTHOL 3 MG MT LOZG: 1.0000 | LOZENGE | OROMUCOSAL | Status: DC | PRN

## 2016-12-26 MED ORDER — MORPHINE SULFATE (PF) 0.5 MG/ML IJ SOLN
INTRAMUSCULAR | Status: AC
  Filled 2016-12-26: qty 10

## 2016-12-26 MED ORDER — BUPROPION HCL ER (XL) 300 MG PO TB24
300.0000 mg | ORAL_TABLET | Freq: Every day | ORAL | Status: DC
  Administered 2016-12-26 – 2016-12-28 (×3): 300 mg via ORAL
  Filled 2016-12-26 (×5): qty 1

## 2016-12-26 MED ORDER — NALBUPHINE HCL 10 MG/ML IJ SOLN
5.0000 mg | INTRAMUSCULAR | Status: DC | PRN
Start: 2016-12-26 — End: 2016-12-29

## 2016-12-26 MED ORDER — OXYCODONE HCL 5 MG PO TABS: 5.0000 mg | ORAL_TABLET | ORAL | Status: DC | PRN

## 2016-12-26 MED ORDER — FENTANYL CITRATE (PF) 100 MCG/2ML IJ SOLN
INTRAMUSCULAR | Status: DC | PRN
  Administered 2016-12-26: 100 ug via INTRAVENOUS

## 2016-12-26 MED ORDER — KETOROLAC TROMETHAMINE 30 MG/ML IJ SOLN: 30.0000 mg | Freq: Four times a day (QID) | INTRAMUSCULAR | Status: AC | PRN

## 2016-12-26 MED ORDER — LACTATED RINGERS IV SOLN
INTRAVENOUS | Status: DC
  Administered 2016-12-26 – 2016-12-27 (×2): via INTRAVENOUS

## 2016-12-26 MED ORDER — DIPHENHYDRAMINE HCL 25 MG PO CAPS
25.0000 mg | ORAL_CAPSULE | Freq: Four times a day (QID) | ORAL | Status: DC | PRN
Start: 2016-12-26 — End: 2016-12-29

## 2016-12-26 MED ORDER — SCOPOLAMINE 1 MG/3DAYS TD PT72
MEDICATED_PATCH | TRANSDERMAL | Status: DC | PRN
  Administered 2016-12-26: 1 via TRANSDERMAL

## 2016-12-26 MED ORDER — SODIUM BICARBONATE 8.4 % IV SOLN
INTRAVENOUS | Status: DC | PRN
  Administered 2016-12-26 (×2): 5 mL via EPIDURAL

## 2016-12-26 MED ORDER — CEFAZOLIN SODIUM-DEXTROSE 2-3 GM-%(50ML) IV SOLR
INTRAVENOUS | Status: AC
  Filled 2016-12-26: qty 50

## 2016-12-26 MED ORDER — ONDANSETRON HCL 4 MG/2ML IJ SOLN: 4.0000 mg | Freq: Three times a day (TID) | INTRAMUSCULAR | Status: DC | PRN

## 2016-12-26 MED ORDER — CEFAZOLIN SODIUM-DEXTROSE 2-3 GM-%(50ML) IV SOLR
INTRAVENOUS | Status: DC | PRN
  Administered 2016-12-26: 2 g via INTRAVENOUS

## 2016-12-26 MED ORDER — ONDANSETRON HCL 4 MG/2ML IJ SOLN
INTRAMUSCULAR | Status: DC | PRN
  Administered 2016-12-26: 4 mg via INTRAVENOUS

## 2016-12-26 MED ORDER — FENTANYL CITRATE (PF) 100 MCG/2ML IJ SOLN
INTRAMUSCULAR | Status: AC
  Filled 2016-12-26: qty 2

## 2016-12-26 MED ORDER — SIMETHICONE 80 MG PO CHEW: 80.0000 mg | CHEWABLE_TABLET | ORAL | Status: DC | PRN

## 2016-12-26 MED ORDER — PRENATAL MULTIVITAMIN CH
1.0000 | ORAL_TABLET | Freq: Every day | ORAL | Status: DC
  Administered 2016-12-27 – 2016-12-29 (×3): 1 via ORAL
  Filled 2016-12-26 (×3): qty 1

## 2016-12-26 MED ORDER — TETANUS-DIPHTH-ACELL PERTUSSIS 5-2.5-18.5 LF-MCG/0.5 IM SUSP: 0.5000 mL | Freq: Once | INTRAMUSCULAR | Status: DC

## 2016-12-26 MED ORDER — DEXAMETHASONE SODIUM PHOSPHATE 10 MG/ML IJ SOLN
INTRAMUSCULAR | Status: DC | PRN
  Administered 2016-12-26: 10 mg via INTRAVENOUS

## 2016-12-26 MED ORDER — SCOPOLAMINE 1 MG/3DAYS TD PT72
MEDICATED_PATCH | TRANSDERMAL | Status: AC
  Filled 2016-12-26: qty 1

## 2016-12-26 MED ORDER — ZOLPIDEM TARTRATE 5 MG PO TABS: 5.0000 mg | ORAL_TABLET | Freq: Every evening | ORAL | Status: DC | PRN

## 2016-12-26 MED ORDER — OXYTOCIN 10 UNIT/ML IJ SOLN
INTRAVENOUS | Status: DC | PRN
  Administered 2016-12-26: 40 [IU] via INTRAVENOUS

## 2016-12-26 MED ORDER — DIBUCAINE 1 % RE OINT: 1.0000 "application " | TOPICAL_OINTMENT | RECTAL | Status: DC | PRN

## 2016-12-26 MED ORDER — DEXAMETHASONE SODIUM PHOSPHATE 10 MG/ML IJ SOLN
INTRAMUSCULAR | Status: AC
  Filled 2016-12-26: qty 1

## 2016-12-26 MED ORDER — MEASLES, MUMPS & RUBELLA VAC ~~LOC~~ INJ
0.5000 mL | INJECTION | Freq: Once | SUBCUTANEOUS | Status: DC
  Filled 2016-12-26: qty 0.5

## 2016-12-26 MED ORDER — OXYTOCIN 10 UNIT/ML IJ SOLN
INTRAMUSCULAR | Status: AC
  Filled 2016-12-26: qty 4

## 2016-12-26 MED ORDER — SODIUM BICARBONATE 8.4 % IV SOLN
INTRAVENOUS | Status: AC
  Filled 2016-12-26: qty 50

## 2016-12-26 MED ORDER — SODIUM CHLORIDE 0.9% FLUSH: 3.0000 mL | INTRAVENOUS | Status: DC | PRN

## 2016-12-26 MED ORDER — MORPHINE SULFATE (PF) 0.5 MG/ML IJ SOLN
INTRAMUSCULAR | Status: DC | PRN
  Administered 2016-12-26: 3 mg via EPIDURAL
  Administered 2016-12-26: 2 mg via INTRAVENOUS

## 2016-12-26 MED ORDER — MAGNESIUM HYDROXIDE 400 MG/5ML PO SUSP: 30.0000 mL | ORAL | Status: DC | PRN

## 2016-12-26 MED ORDER — LIDOCAINE-EPINEPHRINE (PF) 2 %-1:200000 IJ SOLN
INTRAMUSCULAR | Status: AC
  Filled 2016-12-26: qty 20

## 2016-12-26 MED ORDER — IBUPROFEN 600 MG PO TABS
600.0000 mg | ORAL_TABLET | Freq: Four times a day (QID) | ORAL | Status: DC
  Administered 2016-12-26 – 2016-12-29 (×11): 600 mg via ORAL
  Filled 2016-12-26 (×11): qty 1

## 2016-12-26 MED ORDER — ONDANSETRON HCL 4 MG/2ML IJ SOLN
INTRAMUSCULAR | Status: AC
  Filled 2016-12-26: qty 2

## 2016-12-26 MED ORDER — FLUTICASONE PROPIONATE 50 MCG/ACT NA SUSP
2.0000 | Freq: Every day | NASAL | Status: DC
  Administered 2016-12-26 – 2016-12-29 (×3): 2 via NASAL
  Filled 2016-12-26: qty 16

## 2016-12-26 MED ORDER — DIPHENHYDRAMINE HCL 50 MG/ML IJ SOLN: 12.5000 mg | INTRAMUSCULAR | Status: DC | PRN

## 2016-12-26 MED ORDER — NALOXONE HCL 0.4 MG/ML IJ SOLN
1.0000 ug/kg/h | INTRAVENOUS | Status: DC | PRN
  Filled 2016-12-26: qty 5

## 2016-12-26 MED ORDER — LACTATED RINGERS IV SOLN
INTRAVENOUS | Status: DC | PRN
  Administered 2016-12-26: 19:00:00 via INTRAVENOUS

## 2016-12-26 MED ORDER — NALBUPHINE HCL 10 MG/ML IJ SOLN: 5.0000 mg | INTRAMUSCULAR | Status: DC | PRN

## 2016-12-26 MED ORDER — WITCH HAZEL-GLYCERIN EX PADS: 1.0000 "application " | MEDICATED_PAD | CUTANEOUS | Status: DC | PRN

## 2016-12-26 MED ORDER — OXYTOCIN 40 UNITS IN LACTATED RINGERS INFUSION - SIMPLE MED: 2.5000 [IU]/h | INTRAVENOUS | Status: AC

## 2016-12-26 MED ORDER — MEPERIDINE HCL 25 MG/ML IJ SOLN: 6.2500 mg | INTRAMUSCULAR | Status: DC | PRN

## 2016-12-26 SURGICAL SUPPLY — 35 items
BENZOIN TINCTURE PRP APPL 2/3 (GAUZE/BANDAGES/DRESSINGS) ×3 IMPLANT
CHLORAPREP W/TINT 26ML (MISCELLANEOUS) ×3 IMPLANT
CLAMP CORD UMBIL (MISCELLANEOUS) IMPLANT
CLOSURE WOUND 1/2 X4 (GAUZE/BANDAGES/DRESSINGS) ×1
CLOTH BEACON ORANGE TIMEOUT ST (SAFETY) ×3 IMPLANT
CONTAINER PREFILL 10% NBF 15ML (MISCELLANEOUS) IMPLANT
DRSG OPSITE POSTOP 4X10 (GAUZE/BANDAGES/DRESSINGS) ×3 IMPLANT
ELECT REM PT RETURN 9FT ADLT (ELECTROSURGICAL) ×3
ELECTRODE REM PT RTRN 9FT ADLT (ELECTROSURGICAL) ×1 IMPLANT
EXTRACTOR VACUUM KIWI (MISCELLANEOUS) IMPLANT
EXTRACTOR VACUUM M CUP 4 TUBE (SUCTIONS) IMPLANT
EXTRACTOR VACUUM M CUP 4' TUBE (SUCTIONS)
GLOVE BIOGEL PI IND STRL 7.0 (GLOVE) ×1 IMPLANT
GLOVE BIOGEL PI INDICATOR 7.0 (GLOVE) ×2
GLOVE ORTHO TXT STRL SZ7.5 (GLOVE) ×3 IMPLANT
GOWN STRL REUS W/TWL LRG LVL3 (GOWN DISPOSABLE) ×6 IMPLANT
KIT ABG SYR 3ML LUER SLIP (SYRINGE) IMPLANT
NEEDLE HYPO 25X5/8 SAFETYGLIDE (NEEDLE) ×3 IMPLANT
NS IRRIG 1000ML POUR BTL (IV SOLUTION) ×3 IMPLANT
PACK C SECTION WH (CUSTOM PROCEDURE TRAY) ×3 IMPLANT
PAD OB MATERNITY 4.3X12.25 (PERSONAL CARE ITEMS) ×3 IMPLANT
PENCIL SMOKE EVAC W/HOLSTER (ELECTROSURGICAL) ×3 IMPLANT
RTRCTR C-SECT PINK 25CM LRG (MISCELLANEOUS) ×3 IMPLANT
STRIP CLOSURE SKIN 1/2X4 (GAUZE/BANDAGES/DRESSINGS) ×2 IMPLANT
SUT CHROMIC 1 CTX 36 (SUTURE) ×6 IMPLANT
SUT PLAIN 0 NONE (SUTURE) IMPLANT
SUT PLAIN 2 0 XLH (SUTURE) ×3 IMPLANT
SUT VIC AB 0 CT1 27 (SUTURE) ×4
SUT VIC AB 0 CT1 27XBRD ANBCTR (SUTURE) ×2 IMPLANT
SUT VIC AB 2-0 CT1 (SUTURE) ×3 IMPLANT
SUT VIC AB 2-0 CT1 27 (SUTURE) ×2
SUT VIC AB 2-0 CT1 TAPERPNT 27 (SUTURE) ×1 IMPLANT
SUT VIC AB 4-0 KS 27 (SUTURE) IMPLANT
TOWEL OR 17X24 6PK STRL BLUE (TOWEL DISPOSABLE) ×3 IMPLANT
TRAY FOLEY BAG SILVER LF 14FR (SET/KITS/TRAYS/PACK) ×3 IMPLANT

## 2016-12-26 NOTE — Anesthesia Postprocedure Evaluation (Signed)
Anesthesia Post Note  Patient: Brittany Werner  Procedure(s) Performed: CESAREAN SECTION (N/A )     Patient location during evaluation: PACU Anesthesia Type: Epidural Level of consciousness: awake and alert Pain management: pain level controlled Vital Signs Assessment: post-procedure vital signs reviewed and stable Respiratory status: spontaneous breathing and respiratory function stable Cardiovascular status: blood pressure returned to baseline and stable Postop Assessment: epidural receding Anesthetic complications: no    Last Vitals:  Vitals:   12/26/16 2053 12/26/16 2109  BP: 128/79 (!) 134/93  Pulse: (!) 104 97  Resp: 14 14  Temp: 37.3 C 37.4 C  SpO2: 100% 99%    Last Pain:  Vitals:   12/26/16 2109  TempSrc: Oral  PainSc:    Pain Goal:                 Kennieth RadFitzgerald, Jenella Craigie E

## 2016-12-26 NOTE — Progress Notes (Signed)
Has epidural, feeling more pain Afeb, VSS, BP stable FHT- Cat I, ctx q 3-4 min VE-deferred for now, 4 cm last check Continue pitocin at 2430mu/min, monitor progress, hopefully getting into active labor

## 2016-12-26 NOTE — Progress Notes (Signed)
Patient ID: Brittany Werner, female   DOB: 05/05/1980, 36 y.o.   MRN: [id number] Pt comfortable with epidural  afeb vss FHR Category 1 MVU 180 with IUPC  Cervix 80/3-4/-1  Vertex more aligned in pelvis Will bump pitocin once to 20mu  Follow progress, hopefully will enter active phase in next few hours

## 2016-12-26 NOTE — Plan of Care (Signed)
Progressing appropriately.

## 2016-12-26 NOTE — Progress Notes (Signed)
Comfortable with epidural Afeb, VSS, BP labile but overall ok FHT- 130-140, mod variability, + accels, has had some variable decels-none curretnly, Cat II, ctx q 4-5 min VE-4/80/-2, vtx Hopefully still in latent labor.  Had turned pitocin off for an hour and then restarted, just now getting regular ctx.  Will continue pitocin and PCN, monitor progress.

## 2016-12-26 NOTE — Transfer of Care (Signed)
Immediate Anesthesia Transfer of Care Note  Patient: Brittany Werner  Procedure(s) Performed: CESAREAN SECTION (N/A )  Patient Location: PACU  Anesthesia Type:Epidural  Level of Consciousness: awake, alert , oriented and patient cooperative  Airway & Oxygen Therapy: Patient Spontanous Breathing  Post-op Assessment: Report given to RN and Post -op Vital signs reviewed and stable  Post vital signs: Reviewed and stable  Last Vitals:  Vitals:   12/26/16 1730 12/26/16 1800  BP: (!) 148/93 (!) 155/95  Pulse: (!) 102 (!) 111  Resp:    Temp:    SpO2:      Last Pain:  Vitals:   12/26/16 1643  TempSrc:   PainSc: Asleep         Complications: No apparent anesthesia complications

## 2016-12-26 NOTE — Op Note (Signed)
Preoperative diagnosis: Intrauterine pregnancy at 39 weeks, arrest of dilation, PIH Postoperative diagnosis: Same Procedure: Primary low transverse cesarean section without extensions Surgeon: Lavina Hammanodd Aeric Burnham M.D. Anesthesia: Epidural Findings: Patient had normal gravid anatomy and delivered a viable female infant with Apgars of 8 and 8 weight pending Estimated blood loss: 1000 cc Specimens: Placenta sent to labor and delivery Complications: None  Procedure in detail: The patient was taken to the operating room and placed in the dorsosupine position with left tilt. Her previously placed epidural was dosed appropriately.  Abdomen was then prepped and draped in the usual sterile fashion, a foley catheter had previously been inserted. The level of her anesthesia was found to be adequate. Abdomen was entered via a standard Pfannenstiel incision. Once the peritoneal cavity was entered the Alexis disposable self-retaining retractor was placed and good visualization was achieved.  A fairly thick adhesion of the bladder flap to the anterior fundus was taken down with cautery to aid in exposure.   A 4 cm transverse incision was then made in the lower uterine segment pushing the bladder inferior. Once the uterine cavity was entered the incision was extended digitally. The fetal vertex was grasped and delivered through the incision atraumatically. Mouth and nares were suctioned, nuchal cord x 3 reduced. The remainder of the infant then delivered atraumatically. Cord was doubly clamped and cut and the infant handed to the awaiting pediatric team. Cord blood was obtained. The placenta delivered spontaneously. Uterus was wiped dry with clean lap pad and all clots and debris were removed. Uterine incision was inspected and found to be free of extensions. Uterine incision was closed in 2 layers with running locking #1 Chromic for the first layer, followed by a second imbricating layer of #1 Chromic. Tubes and ovaries were  inspected and found to be normal. Uterine incision was inspected and found to be hemostatic. Bleeding from serosal edges was controlled with electrocautery. The Alexis retractor was removed. Subfascial space was irrigated and made hemostatic with electrocautery. Peritoneum was closed with 2-0 Vicryl.  Fascia was closed in running fashion starting at both ends and meeting in the middle with 0 Vicryl. Subcutaneous tissue was then irrigated and made hemostatic with electrocautery, then closed with running 2-0 plain gut. Skin was closed with running 4-0 Vicryl subcuticular suture followed by steri-strips and a sterile dressing. Patient tolerated the procedure well and was taken to the recovery in stable condition. Counts were correct x2, she received Ancef 2 g IV at the beginning of the procedure and she had PAS hose on throughout the procedure.

## 2016-12-26 NOTE — Progress Notes (Signed)
Pt requesting c-section, does not want to wait anymore Afeb, VSS, BP 140/90 FHT- currently Cat I VE-unchanged per RN.  Discussed situation, c-section procedure and risks, she wants to go ahead with c-section.  Will proceed when OR is ready.

## 2016-12-27 ENCOUNTER — Encounter (HOSPITAL_COMMUNITY): Payer: Self-pay | Admitting: Obstetrics and Gynecology

## 2016-12-27 LAB — CBC
HEMATOCRIT: 29.7 % — AB (ref 36.0–46.0)
HEMOGLOBIN: 9.8 g/dL — AB (ref 12.0–15.0)
MCH: 28.9 pg (ref 26.0–34.0)
MCHC: 33 g/dL (ref 30.0–36.0)
MCV: 87.6 fL (ref 78.0–100.0)
Platelets: 226 10*3/uL (ref 150–400)
RBC: 3.39 MIL/uL — ABNORMAL LOW (ref 3.87–5.11)
RDW: 14.9 % (ref 11.5–15.5)
WBC: 15.5 10*3/uL — ABNORMAL HIGH (ref 4.0–10.5)

## 2016-12-27 NOTE — Anesthesia Postprocedure Evaluation (Signed)
Anesthesia Post Note  Patient: Brittany Werner  Procedure(s) Performed: CESAREAN SECTION (N/A )     Patient location during evaluation: Mother Baby Anesthesia Type: Epidural Level of consciousness: awake, awake and alert, oriented and patient cooperative Pain management: pain level controlled Vital Signs Assessment: post-procedure vital signs reviewed and stable Respiratory status: spontaneous breathing, nonlabored ventilation and respiratory function stable Cardiovascular status: stable Postop Assessment: no headache, no backache, patient able to bend at knees and no apparent nausea or vomiting Anesthetic complications: no    Last Vitals:  Vitals:   12/27/16 0420 12/27/16 0519  BP:  138/76  Pulse:  82  Resp:  16  Temp:  37.1 C  SpO2: 96% 97%    Last Pain:  Vitals:   12/27/16 0519  TempSrc: Oral  PainSc: 3    Pain Goal:                 Brittany Werner

## 2016-12-27 NOTE — Progress Notes (Signed)
Subjective: Postpartum Day 1: Cesarean Delivery Patient reports incisional pain and tolerating PO.  Routine postop/postpartum care  Objective: Vital signs in last 24 hours: Temp:  [97.6 F (36.4 C)-99.4 F (37.4 C)] 98.8 F (37.1 C) (11/16 0519) Pulse Rate:  [72-123] 82 (11/16 0519) Resp:  [12-18] 16 (11/16 0519) BP: (118-158)/(60-130) 138/76 (11/16 0519) SpO2:  [94 %-100 %] 97 % (11/16 0519)  Physical Exam:  General: alert and no distress Lochia: appropriate Uterine Fundus: firm Incision: healing well DVT Evaluation: No evidence of DVT seen on physical exam.  Recent Labs    12/26/16 2003 12/27/16 0504  HGB 10.5* 9.8*  HCT 32.2* 29.7*    Assessment/Plan: Status post Cesarean section. Doing well postoperatively.  Continue current care.  Brittany Werner 12/27/2016, 7:09 AM

## 2016-12-27 NOTE — Addendum Note (Signed)
Addendum  created 12/27/16 0816 by Yolonda Kidaarver, Brayn Eckstein L, CRNA   Sign clinical note

## 2016-12-27 NOTE — Lactation Note (Signed)
This note was copied from a baby's chart. Lactation Consultation Note  Patient Name: Brittany Werner WUJWJ'XToday's Date: 12/27/2016 Reason for consult: Initial assessment;Difficult latch  Baby 17 hours old. Mom reports that baby is not latching and she is having trouble hand expressing herself. Assisted mom to latch baby to right breast in football position. However, baby tongue-thrusting and would not suckle this LC's gloved finger. Discussed infant behavior with parents and enc suck training with clean finger--soap and water, not gel. Assisted mom with hand expression, and mom return demonstrated hand expression with colostrum easily expressible. Assisted FOB to spoon and finger feed baby the 5 ml of EBM mom able to collect. Set mom up with DEBP and enc mom to hand express after pumping for 15 minutes. Discussed ebm storage guidelines, and gave supplementation guidelines with review. Gave curve-tipped syringe with review as well.   Enc mom to offer lots of STS and nurse with cues, then supplement with EBM and then for mom to post-pump followed by hand expression. Assisted by Charisse KlinefelterBria, RN who obtained permission from [place], RN to remove mom's IV because insertion was leaking. Discussed BF assessment and interventions with Morrie SheldonAshley, RN as well.   Maternal Data Has patient been taught Hand Expression?: Yes Does the patient have breastfeeding experience prior to this delivery?: No  Feeding Feeding Type: Breast Fed Length of feed: 0 min  LATCH Score Latch: Too sleepy or reluctant, no latch achieved, no sucking elicited.  Audible Swallowing: None  Type of Nipple: Everted at rest and after stimulation  Comfort (Breast/Nipple): Soft / non-tender  Hold (Positioning): Assistance needed to correctly position infant at breast and maintain latch.  LATCH Score: 5  Interventions Interventions: Breast feeding basics reviewed;Assisted with latch;Skin to skin;Hand express;Breast compression;Adjust  position;Support pillows;Position options;DEBP  Lactation Tools Discussed/Used Tools: Pump Breast pump type: Double-Electric Breast Pump Pump Review: Setup, frequency, and cleaning;Milk Storage Initiated by:: JW Date initiated:: 12/27/16   Consult Status Consult Status: Follow-up Date: 12/28/16 Follow-up type: In-patient    Brittany Werner 12/27/2016, 11:38 AM

## 2016-12-28 NOTE — Progress Notes (Signed)
Subjective: Postpartum Day 2: Cesarean Delivery Patient reports incisional pain, tolerating PO and no problems voiding.    Objective: Vital signs in last 24 hours: Temp:  [97.8 F (36.6 C)-98.4 F (36.9 C)] 97.8 F (36.6 C) (11/17 0540) Pulse Rate:  [68-94] 68 (11/17 0540) Resp:  [18] 18 (11/17 0540) BP: (106-121)/(73-76) 121/76 (11/17 0540) SpO2:  [98 %-100 %] 100 % (11/16 1340)  Physical Exam:  General: alert and no distress Lochia: appropriate Uterine Fundus: firm Incision: healing well DVT Evaluation: No evidence of DVT seen on physical exam.  Recent Labs    12/26/16 2003 12/27/16 0504  HGB 10.5* 9.8*  HCT 32.2* 29.7*    Assessment/Plan: Status post Cesarean section. Doing well postoperatively.  Continue current care, routine PP care.  Elnora Quizon Bovard-Stuckert 12/28/2016, 10:10 AM

## 2016-12-28 NOTE — Lactation Note (Signed)
This note was copied from a baby's chart. Lactation Consultation Note  Patient Name: Brittany Werner's Date: 12/28/2016 Reason for consult: Follow-up assessment   Baby 2343 hours old and has not latched per Charity fundraiserN and parents.  IVF infant. Reviewed hand expression with a few drops expressed. Mother has pumped 2x yesterday and 1 times today and is disappointed about volume. Provided education regarding how milk comes to volume and encouraged her to continue to post pump.  Applied #24NS, prefilled nipple with formula and applied 5 french feeding tube. This is infant's first time being supplemented. Baby took approx 12 ml and fell asleep.  Per MD mother is to increase volume supplementation to 30 ml. Suggest mother post pump after feedings to stimulate milk supply. Mom encouraged to feed baby 8-12 times/24 hours and with feeding cues at least q 3 hours.  Reviewed cleaning of 5 french.      Maternal Data Has patient been taught Hand Expression?: Yes Does the patient have breastfeeding experience prior to this delivery?: No  Feeding Feeding Type: Breast Fed Length of feed: 20 min  LATCH Score Latch: Repeated attempts needed to sustain latch, nipple held in mouth throughout feeding, stimulation needed to elicit sucking reflex.  Audible Swallowing: A few with stimulation  Type of Nipple: Everted at rest and after stimulation  Comfort (Breast/Nipple): Soft / non-tender  Hold (Positioning): Assistance needed to correctly position infant at breast and maintain latch.  LATCH Score: 7  Interventions Interventions: Breast feeding basics reviewed;Hand express;DEBP  Lactation Tools Discussed/Used Tools: Pump;Supplemental Nutrition System;21F feeding tube / Syringe Breast pump type: Double-Electric Breast Pump   Consult Status Consult Status: Follow-up Date: 12/29/16 Follow-up type: In-patient    Dahlia ByesBerkelhammer, Ruth [place] LLCBoschen 12/28/2016, 2:30 PM

## 2016-12-29 MED ORDER — PRENATAL MULTIVITAMIN CH: 1.0000 | ORAL_TABLET | Freq: Every day | ORAL | 3 refills | Status: DC

## 2016-12-29 MED ORDER — IBUPROFEN 600 MG PO TABS: 600.0000 mg | ORAL_TABLET | Freq: Four times a day (QID) | ORAL | 1 refills | Status: DC | PRN

## 2016-12-29 MED ORDER — OXYCODONE HCL 5 MG PO TABS: ORAL_TABLET | ORAL | 0 refills | Status: DC

## 2016-12-29 NOTE — Lactation Note (Signed)
This note was copied from a baby's chart. Lactation Consultation Note: Discussed supplemental guidelines with parents . Infant was fed at 0645 and took 6 ml. Advised parents to increase amts up to 30 ml every 2-3 hours. Mother reports that she still only has a small amt of colostrum and infant refused breast due to no milk.  Parents are using pacifier.   Mother reports that she has not had time to pump her breast due to many people coming in the room so often.  Discussed methods of supplementing and parents report that they have found the best way to give Joy formula. They like using the #5 fr feeding tube and finger feeding.  Mother informed of treatment and prevention of engorgement. Mother has a Public librarianmedela electric pump at home.   Mother was offered assistance with latch and use of the nipple shield. Mother declined and reports that she doesn't need anything or have any questions or breastfeeding concerns.She was also offered a follow up visit with LC after her milk comes in. Mother reports that she will call office as needed. [facility]  Patient Name: Brittany Thornton ParkKristen Nilsson GEXBM'WToday's Date: 12/29/2016 Reason for consult: Follow-up assessment   Maternal Data    Feeding Length of feed: 15 min  LATCH Score                   Interventions    Lactation Tools Discussed/Used     Consult Status Consult Status: Complete    Michel BickersKendrick, Marguis Mathieson McCoy 12/29/2016, 9:16 AM

## 2016-12-29 NOTE — Discharge Summary (Signed)
OB Discharge Summary     Patient Name: Brittany ParkKristen Teuscher DOB: 05/13/1980 MRN: [id number]  Date of admission: 12/25/2016 Delivering MD: Jackelyn KnifeMEISINGER, TODD   Date of discharge: 12/29/2016  Admitting diagnosis: INDUCTION Intrauterine pregnancy: 448w5d     Secondary diagnosis:  Active Problems:   Gestational hypertension w/o significant proteinuria in 3rd trimester  Additional problems: N/A     Discharge diagnosis: Term Pregnancy Delivered                                                                                                Post partum procedures:N/A  Augmentation: AROM and Pitocin  Complications: None  Hospital course:  Induction of Labor With Cesarean Section  36 y.o. yo G1P1001 at 738w5d was admitted to the hospital 12/25/2016 for induction of labor. Patient had a labor course significant for pt desire for LTCS. The patient went for cesarean section due to pt request, and delivered a Viable infant,@BABYSUPPRESS (DBLINK,ept,110,,1,,) Membrane Rupture Time/Date: )2:12 PM ,12/25/2016   @Details  of operation can be found in separate operative Note.  Patient had an uncomplicated postpartum course. She is ambulating, tolerating a regular diet, passing flatus, and urinating well.  Patient is discharged home in stable condition on 12/29/16.                                    Physical exam  Vitals:   12/28/16 0540 12/28/16 1749 12/28/16 1845 12/29/16 0547  BP: 121/76 (!) 147/81 134/78 135/84  Pulse: 68 92 89 76  Resp: 18 18  18   Temp: 97.8 F (36.6 C) 99.5 F (37.5 C)  98.6 F (37 C)  TempSrc: Oral   Oral  SpO2:      Weight:      Height:       General: alert and no distress Lochia: appropriate Uterine Fundus: firm Incision: Healing well with no significant drainage DVT Evaluation: No evidence of DVT seen on physical exam. Labs: Lab Results  Component Value Date   WBC 15.5 (H) 12/27/2016   HGB 9.8 (L) 12/27/2016   HCT 29.7 (L) 12/27/2016   MCV 87.6 12/27/2016   PLT  226 12/27/2016   CMP Latest Ref Rng & Units 12/25/2016  Glucose 65 - 99 mg/dL 865(H103(H)  BUN 6 - 20 mg/dL 84(O21(H)  Creatinine 9.620.44 - 1.00 mg/dL 9.520.79  Sodium 841135 - 324145 mmol/L 136  Potassium 3.5 - 5.1 mmol/L 3.9  Chloride 101 - 111 mmol/L 109  CO2 22 - 32 mmol/L 20(L)  Calcium 8.9 - 10.3 mg/dL 4.0(N8.8(L)  Total Protein 6.5 - 8.1 g/dL 0.2(V5.5(L)  Total Bilirubin 0.3 - 1.2 mg/dL 0.3  Alkaline Phos 38 - 126 U/L 160(H)  AST 15 - 41 U/L 14(L)  ALT 14 - 54 U/L 12(L)    Discharge instruction: per After Visit Summary and "Baby and Me Booklet".  After visit meds:  Allergies as of 12/29/2016      Reactions   Sulfa Antibiotics Swelling   Of the lips      Medication List  TAKE these medications   buPROPion 300 MG 24 hr tablet Commonly known as:  WELLBUTRIN XL Take 300 mg by mouth daily.   calcium carbonate 500 MG chewable tablet Commonly known as:  TUMS - dosed in mg elemental calcium Chew 2 tablets 3 (three) times daily as needed by mouth for indigestion or heartburn.   fluticasone 50 MCG/ACT nasal spray Commonly known as:  FLONASE Place 2 sprays daily into both nostrils.   ibuprofen 600 MG tablet Commonly known as:  ADVIL,MOTRIN Take 1 tablet (600 mg total) every 6 (six) hours as needed by mouth.   oxyCODONE 5 MG immediate release tablet Commonly known as:  Oxy IR/ROXICODONE 1-2 po q 6hr prn severe pain   prenatal multivitamin Tabs tablet Take 1 tablet at bedtime by mouth.       Diet: routine diet  Activity: Advance as tolerated. Pelvic rest for 6 weeks.   Outpatient follow up:2 and 6 weeks Follow up Appt:No future appointments. Follow up Visit:No Follow-up on file.  Postpartum contraception: Undecided  Newborn Data: Live born female  Birth Weight: 8 lb 1.3 oz (3665 g) APGAR: 8, 8  Newborn Delivery   Birth date/time:  12/26/2016 18:34:00 Delivery type:  C-Section, Low Transverse C-section categorization:  Primary     Baby Feeding: Bottle and  Breast Disposition:home with mother   12/29/2016 Sherian ReinJody Bovard-Stuckert, MD

## 2016-12-29 NOTE — Progress Notes (Signed)
Subjective: Postpartum Day 3: Cesarean Delivery Patient reports incisional pain, tolerating PO and no problems voiding. Nl lochia, pain controlled   Objective: Vital signs in last 24 hours: Temp:  [98.6 F (37 C)-99.5 F (37.5 C)] 98.6 F (37 C) (11/18 0547) Pulse Rate:  [76-92] 76 (11/18 0547) Resp:  [18] 18 (11/18 0547) BP: (134-147)/(78-84) 135/84 (11/18 0547)  Physical Exam:  General: alert and no distress Lochia: appropriate Uterine Fundus: firm Incision: healing well DVT Evaluation: No evidence of DVT seen on physical exam.  Recent Labs    12/26/16 2003 12/27/16 0504  HGB 10.5* 9.8*  HCT 32.2* 29.7*    Assessment/Plan: Status post Cesarean section. Doing well postoperatively.  Discharge home with standard precautions and return to clinic in 2 weeks. D/C home with Motrin, percocet and PNV.    Brittany Werner 12/29/2016, 8:43 AM

## 2017-01-03 ENCOUNTER — Inpatient Hospital Stay (HOSPITAL_COMMUNITY): Payer: PRIVATE HEALTH INSURANCE

## 2019-05-17 DIAGNOSIS — Z3A13 13 weeks gestation of pregnancy: Secondary | ICD-10-CM | POA: Diagnosis not present

## 2019-05-17 DIAGNOSIS — Z1272 Encounter for screening for malignant neoplasm of vagina: Secondary | ICD-10-CM | POA: Diagnosis not present

## 2019-05-17 DIAGNOSIS — O09819 Supervision of pregnancy resulting from assisted reproductive technology, unspecified trimester: Secondary | ICD-10-CM | POA: Diagnosis not present

## 2019-05-17 DIAGNOSIS — Z1151 Encounter for screening for human papillomavirus (HPV): Secondary | ICD-10-CM | POA: Diagnosis not present

## 2019-05-17 DIAGNOSIS — Z3689 Encounter for other specified antenatal screening: Secondary | ICD-10-CM | POA: Diagnosis not present

## 2019-05-17 DIAGNOSIS — Z113 Encounter for screening for infections with a predominantly sexual mode of transmission: Secondary | ICD-10-CM | POA: Diagnosis not present

## 2019-05-17 DIAGNOSIS — O09529 Supervision of elderly multigravida, unspecified trimester: Secondary | ICD-10-CM | POA: Diagnosis not present

## 2019-05-22 ENCOUNTER — Ambulatory Visit: Payer: PRIVATE HEALTH INSURANCE | Attending: Internal Medicine

## 2019-05-22 DIAGNOSIS — Z23 Encounter for immunization: Secondary | ICD-10-CM

## 2019-05-22 NOTE — Progress Notes (Signed)
   Covid-19 Vaccination Clinic  Name:  Brittany Werner    MRN: [id number] DOB: 13-May-1980  05/22/2019  [name] was observed post Covid-19 immunization for 15 minutes without incident. She was provided with Vaccine Information Sheet and instruction to access the V-Safe system.   [name] was instructed to call 911 with any severe reactions post vaccine: Marland Kitchen Difficulty breathing  . Swelling of face and throat  . A fast heartbeat  . A bad rash all over body  . Dizziness and weakness   Immunizations Administered    Name Date Dose VIS Date Route   Pfizer COVID-19 Vaccine 05/22/2019 12:08 PM 0.3 mL 01/22/2019 Intramuscular   Manufacturer: [employer], [employer]   Lot: [id number]   NDC: [address]-9

## 2019-05-28 DIAGNOSIS — F329 Major depressive disorder, single episode, unspecified: Secondary | ICD-10-CM | POA: Diagnosis not present

## 2019-06-14 ENCOUNTER — Ambulatory Visit: Payer: PRIVATE HEALTH INSURANCE

## 2019-06-23 ENCOUNTER — Ambulatory Visit: Payer: PRIVATE HEALTH INSURANCE

## 2019-06-28 DIAGNOSIS — O09512 Supervision of elderly primigravida, second trimester: Secondary | ICD-10-CM | POA: Diagnosis not present

## 2019-06-28 DIAGNOSIS — Z3A19 19 weeks gestation of pregnancy: Secondary | ICD-10-CM | POA: Diagnosis not present

## 2019-06-28 DIAGNOSIS — Z363 Encounter for antenatal screening for malformations: Secondary | ICD-10-CM | POA: Diagnosis not present

## 2019-06-28 DIAGNOSIS — O99212 Obesity complicating pregnancy, second trimester: Secondary | ICD-10-CM | POA: Diagnosis not present

## 2019-06-28 DIAGNOSIS — Z8632 Personal history of gestational diabetes: Secondary | ICD-10-CM | POA: Diagnosis not present

## 2019-07-02 DIAGNOSIS — O99810 Abnormal glucose complicating pregnancy: Secondary | ICD-10-CM | POA: Diagnosis not present

## 2019-07-23 DIAGNOSIS — Q751 Craniofacial dysostosis: Secondary | ICD-10-CM | POA: Diagnosis not present

## 2019-07-28 DIAGNOSIS — O24419 Gestational diabetes mellitus in pregnancy, unspecified control: Secondary | ICD-10-CM | POA: Diagnosis not present

## 2019-08-30 DIAGNOSIS — Z23 Encounter for immunization: Secondary | ICD-10-CM | POA: Diagnosis not present

## 2019-09-20 DIAGNOSIS — Z3689 Encounter for other specified antenatal screening: Secondary | ICD-10-CM | POA: Diagnosis not present

## 2019-09-27 DIAGNOSIS — O09513 Supervision of elderly primigravida, third trimester: Secondary | ICD-10-CM | POA: Diagnosis not present

## 2019-09-27 DIAGNOSIS — Z3A32 32 weeks gestation of pregnancy: Secondary | ICD-10-CM | POA: Diagnosis not present

## 2019-09-27 DIAGNOSIS — O09813 Supervision of pregnancy resulting from assisted reproductive technology, third trimester: Secondary | ICD-10-CM | POA: Diagnosis not present

## 2019-10-04 ENCOUNTER — Inpatient Hospital Stay (HOSPITAL_COMMUNITY)
Admission: AD | Admit: 2019-10-04 | Discharge: 2019-10-04 | Disposition: A | Discharge status: Home / Self Care | Payer: BC Managed Care – PPO | Attending: Obstetrics and Gynecology | Admitting: Obstetrics and Gynecology

## 2019-10-04 ENCOUNTER — Other Ambulatory Visit: Payer: Self-pay

## 2019-10-04 ENCOUNTER — Encounter (HOSPITAL_COMMUNITY): Payer: Self-pay | Admitting: *Deleted

## 2019-10-04 DIAGNOSIS — O99353 Diseases of the nervous system complicating pregnancy, third trimester: Secondary | ICD-10-CM | POA: Diagnosis not present

## 2019-10-04 DIAGNOSIS — O09523 Supervision of elderly multigravida, third trimester: Secondary | ICD-10-CM | POA: Insufficient documentation

## 2019-10-04 DIAGNOSIS — O99513 Diseases of the respiratory system complicating pregnancy, third trimester: Secondary | ICD-10-CM | POA: Diagnosis not present

## 2019-10-04 DIAGNOSIS — O1493 Unspecified pre-eclampsia, third trimester: Secondary | ICD-10-CM | POA: Insufficient documentation

## 2019-10-04 DIAGNOSIS — O26893 Other specified pregnancy related conditions, third trimester: Secondary | ICD-10-CM | POA: Diagnosis not present

## 2019-10-04 DIAGNOSIS — O99280 Endocrine, nutritional and metabolic diseases complicating pregnancy, unspecified trimester: Secondary | ICD-10-CM

## 2019-10-04 DIAGNOSIS — J069 Acute upper respiratory infection, unspecified: Secondary | ICD-10-CM | POA: Diagnosis not present

## 2019-10-04 DIAGNOSIS — Z79899 Other long term (current) drug therapy: Secondary | ICD-10-CM | POA: Diagnosis not present

## 2019-10-04 DIAGNOSIS — Z3A33 33 weeks gestation of pregnancy: Secondary | ICD-10-CM | POA: Insufficient documentation

## 2019-10-04 DIAGNOSIS — G4730 Sleep apnea, unspecified: Secondary | ICD-10-CM | POA: Insufficient documentation

## 2019-10-04 DIAGNOSIS — O09293 Supervision of pregnancy with other poor reproductive or obstetric history, third trimester: Secondary | ICD-10-CM | POA: Insufficient documentation

## 2019-10-04 DIAGNOSIS — Z794 Long term (current) use of insulin: Secondary | ICD-10-CM | POA: Diagnosis not present

## 2019-10-04 DIAGNOSIS — E860 Dehydration: Secondary | ICD-10-CM | POA: Insufficient documentation

## 2019-10-04 DIAGNOSIS — O99283 Endocrine, nutritional and metabolic diseases complicating pregnancy, third trimester: Secondary | ICD-10-CM | POA: Diagnosis not present

## 2019-10-04 DIAGNOSIS — Z882 Allergy status to sulfonamides status: Secondary | ICD-10-CM | POA: Insufficient documentation

## 2019-10-04 DIAGNOSIS — Z8759 Personal history of other complications of pregnancy, childbirth and the puerperium: Secondary | ICD-10-CM | POA: Insufficient documentation

## 2019-10-04 DIAGNOSIS — R030 Elevated blood-pressure reading, without diagnosis of hypertension: Secondary | ICD-10-CM | POA: Diagnosis not present

## 2019-10-04 LAB — URINALYSIS, ROUTINE W REFLEX MICROSCOPIC
Glucose, UA: NEGATIVE mg/dL
Hgb urine dipstick: NEGATIVE
Ketones, ur: 5 mg/dL — AB
Leukocytes,Ua: NEGATIVE
Nitrite: NEGATIVE
Protein, ur: 100 mg/dL — AB
Specific Gravity, Urine: 1.041 — ABNORMAL HIGH (ref 1.005–1.030)
pH: 5 (ref 5.0–8.0)

## 2019-10-04 LAB — CBC
HCT: 32.7 % — ABNORMAL LOW (ref 36.0–46.0)
Hemoglobin: 10.2 g/dL — ABNORMAL LOW (ref 12.0–15.0)
MCH: 24.8 pg — ABNORMAL LOW (ref 26.0–34.0)
MCHC: 31.2 g/dL (ref 30.0–36.0)
MCV: 79.4 fL — ABNORMAL LOW (ref 80.0–100.0)
Platelets: 287 10*3/uL (ref 150–400)
RBC: 4.12 MIL/uL (ref 3.87–5.11)
RDW: 15.6 % — ABNORMAL HIGH (ref 11.5–15.5)
WBC: 9.5 10*3/uL (ref 4.0–10.5)
nRBC: 0 % (ref 0.0–0.2)

## 2019-10-04 LAB — COMPREHENSIVE METABOLIC PANEL
ALT: 14 U/L (ref 0–44)
AST: 15 U/L (ref 15–41)
Albumin: 2.4 g/dL — ABNORMAL LOW (ref 3.5–5.0)
Alkaline Phosphatase: 149 U/L — ABNORMAL HIGH (ref 38–126)
Anion gap: 8 (ref 5–15)
BUN: 9 mg/dL (ref 6–20)
CO2: 18 mmol/L — ABNORMAL LOW (ref 22–32)
Calcium: 8.4 mg/dL — ABNORMAL LOW (ref 8.9–10.3)
Chloride: 108 mmol/L (ref 98–111)
Creatinine, Ser: 0.75 mg/dL (ref 0.44–1.00)
GFR calc Af Amer: >60 mL/min (ref >60)
GFR calc non Af Amer: >60 mL/min (ref >60)
Glucose, Bld: 110 mg/dL — ABNORMAL HIGH (ref 70–99)
Potassium: 4 mmol/L (ref 3.5–5.1)
Sodium: 134 mmol/L — ABNORMAL LOW (ref 135–145)
Total Bilirubin: 0.3 mg/dL (ref 0.3–1.2)
Total Protein: 5.7 g/dL — ABNORMAL LOW (ref 6.5–8.1)

## 2019-10-04 LAB — PROTEIN / CREATININE RATIO, URINE
Creatinine, Urine: 260 mg/dL
Protein Creatinine Ratio: 0.36 mg/mg{Cre} — ABNORMAL HIGH (ref 0.00–0.15)
Total Protein, Urine: 94 mg/dL

## 2019-10-04 MED ORDER — ACETAMINOPHEN 500 MG PO TABS
1000.0000 mg | ORAL_TABLET | Freq: Once | ORAL | Status: AC
  Administered 2019-10-04: 14:00:00 1000 mg via ORAL
  Filled 2019-10-04: qty 2

## 2019-10-04 MED ORDER — LACTATED RINGERS IV SOLN
Freq: Once | INTRAVENOUS | Status: AC
Start: 2019-10-04 — End: 2019-10-04

## 2019-10-04 NOTE — Progress Notes (Signed)
Tracing sketchy secondary FM

## 2019-10-04 NOTE — Discharge Instructions (Signed)
Follow up in the office on Friday for BP reeval. Call the office if your symptoms are worsening or if headache is recurring or if you develop visual changes or RUQ pain. Drink at least 8 8-oz glasses of water every day. Plan to rest at home at least today and tomorrow until your congestion is improving. Expect your baby to be moving daily as it has been.

## 2019-10-04 NOTE — MAU Provider Note (Signed)
History     CSN: [id number]  Arrival date and time: 10/04/19 1235   First Provider Initiated Contact with Patient 10/04/19 1324      Chief Complaint  Patient presents with  . Headache  . Hypertension  . Cough  . Nasal Congestion   HPI Brittany Werner  39 y.o.  [redacted]w[redacted]d  Comes to MAU today from the office.  BP was elevated at home today and in the office.  Client has had BP issues in the last part of her pregnancy last time in 2018.  Today has a headache and took Tylenol at approx 10 am.  Has not noticed any difference in her headache.  It is not a severe headache.  Did a home covid test and it was negative.  Has had a history of gestational hypertension.  And did have increased blood pressure at the end of her previous pregnancy.  OB History    Gravida  2   Para  1   Term  1   Preterm      AB      Living  1     SAB      TAB      Ectopic      Multiple  0   Live Births  1           Past Medical History:  Diagnosis Date  . PMDD (premenstrual dysphoric disorder)   . Sleep apnea     Past Surgical History:  Procedure Laterality Date  . CESAREAN SECTION N/A 12/26/2016   Procedure: CESAREAN SECTION;  Surgeon: Lavina Hamman, MD;  Location: [facility] BIRTHING SUITES;  Service: Obstetrics;  Laterality: N/A;  . chin surgery    . EYE SURGERY    . UTERINE FIBROID SURGERY      Family History  Problem Relation Age of Onset  . Cancer Mother        Breast  . Hypertension Brother   . Diabetes Maternal Grandmother   . Hypertension Maternal Grandfather   . Heart attack Father     Social History   Tobacco Use  . Smoking status: Never Smoker  . Smokeless tobacco: Never Used  Vaping Use  . Vaping Use: Never used  Substance Use Topics  . Alcohol use: No    Comment: occasionally  . Drug use: No    Allergies:  Allergies  Allergen Reactions  . Sulfa Antibiotics Swelling    Of the lips    Medications Prior to Admission  Medication Sig Dispense Refill Last  Dose  . acetaminophen (TYLENOL) 500 MG tablet Take 1,000 mg by mouth every 6 (six) hours as needed.   10/04/2019 at 0930  . buPROPion (WELLBUTRIN XL) 300 MG 24 hr tablet Take 300 mg by mouth daily.   10/04/2019 at 0930  . fluticasone (FLONASE) 50 MCG/ACT nasal spray Place 2 sprays daily into both nostrils.    10/03/2019 at Unknown time  . insulin aspart (NOVOLOG) 100 UNIT/ML injection Inject 10 Units into the skin at bedtime.   10/03/2019 at 2200  . metFORMIN (GLUCOPHAGE) 1000 MG tablet Take 1,000 mg by mouth at bedtime.   10/03/2019 at 2200  . oxymetazoline (AFRIN) 0.05 % nasal spray Place 2 sprays into both nostrils 2 (two) times daily.   10/04/2019 at 1130  . Prenatal Vit-Fe Fumarate-FA (PRENATAL MULTIVITAMIN) TABS tablet Take 1 tablet at bedtime by mouth. 100 tablet 3 10/04/2019 at 0930  . calcium carbonate (TUMS - DOSED IN MG ELEMENTAL CALCIUM) 500 MG chewable  tablet Chew 2 tablets 3 (three) times daily as needed by mouth for indigestion or heartburn.   10/02/2019  . ibuprofen (ADVIL,MOTRIN) 600 MG tablet Take 1 tablet (600 mg total) every 6 (six) hours as needed by mouth. 45 tablet 1   . oxyCODONE (OXY IR/ROXICODONE) 5 MG immediate release tablet 1-2 po q 6hr prn severe pain 25 tablet 0     Review of Systems  Constitutional: Negative for fever.  HENT: Positive for congestion.   Eyes: Negative for visual disturbance.  Respiratory: Negative for shortness of breath and wheezing.   Cardiovascular: Negative for leg swelling.  Gastrointestinal: Negative for abdominal pain, nausea and vomiting.  Genitourinary: Negative for vaginal bleeding and vaginal discharge.  Neurological: Positive for headaches.   Physical Exam   Blood pressure (!) 142/92, pulse (!) 125, temperature 98.5 F (36.9 C), temperature source Oral, resp. rate 18, height 5' (1.524 m), weight 88.1 kg, SpO2 100 %, unknown if currently breastfeeding.  Physical Exam Vitals and nursing note reviewed.  Constitutional:      Appearance:  She is well-developed.  HENT:     Head: Normocephalic.     Nose: Congestion present.  Pulmonary:     Effort: Pulmonary effort is normal.  Abdominal:     Palpations: Abdomen is soft.     Tenderness: There is no abdominal tenderness. There is no guarding or rebound.     Comments: FHT 135 baseline with moderate variability  No contractions no decelerations.  15x15 accels noted. Category 1 tracing.  Musculoskeletal:        General: Normal range of motion.     Cervical back: Neck supple.     Right lower leg: No edema.     Left lower leg: No edema.  Skin:    General: Skin is warm and dry.  Neurological:     Mental Status: She is alert and oriented to person, place, and time.  Psychiatric:        Mood and Affect: Mood normal.        Thought Content: Thought content normal.        Judgment: Judgment normal.    Vitals:   10/04/19 1400 10/04/19 1415 10/04/19 1430 10/04/19 1445  BP: (!) 131/94 134/71 (!) 141/92 (!) 148/97  Pulse: 99 93 (!) 110 (!) 107  Resp:      Temp:      TempSrc:      SpO2:      Weight:      Height:        MAU Course  Procedures LABS Results for orders placed or performed during the hospital encounter of 10/04/19 (from the past 24 hour(s))  CBC     Status: Abnormal   Collection Time: 10/04/19  1:20 PM  Result Value Ref Range   WBC 9.5 4.0 - 10.5 K/uL   RBC 4.12 3.87 - 5.11 MIL/uL   Hemoglobin 10.2 (L) 12.0 - 15.0 g/dL   HCT 46.9 (L) 36 - 46 %   MCV 79.4 (L) 80.0 - 100.0 fL   MCH 24.8 (L) 26.0 - 34.0 pg   MCHC 31.2 30.0 - 36.0 g/dL   RDW 62.9 (H) 52.8 - 41.3 %   Platelets 287 150 - 400 K/uL   nRBC 0.0 0.0 - 0.2 %  Comprehensive metabolic panel     Status: Abnormal   Collection Time: 10/04/19  1:20 PM  Result Value Ref Range   Sodium 134 (L) 135 - 145 mmol/L   Potassium 4.0 3.5 -  5.1 mmol/L   Chloride 108 98 - 111 mmol/L   CO2 18 (L) 22 - 32 mmol/L   Glucose, Bld 110 (H) 70 - 99 mg/dL   BUN 9 6 - 20 mg/dL   Creatinine, Ser 8.25 0.44 - 1.00 mg/dL    Calcium 8.4 (L) 8.9 - 10.3 mg/dL   Total Protein 5.7 (L) 6.5 - 8.1 g/dL   Albumin 2.4 (L) 3.5 - 5.0 g/dL   AST 15 15 - 41 U/L   ALT 14 0 - 44 U/L   Alkaline Phosphatase 149 (H) 38 - 126 U/L   Total Bilirubin 0.3 0.3 - 1.2 mg/dL   GFR calc non Af Amer >60 >60 mL/min   GFR calc Af Amer >60 >60 mL/min   Anion gap 8 5 - 15  Urinalysis, Routine w reflex microscopic Urine, Clean Catch     Status: Abnormal   Collection Time: 10/04/19  1:23 PM  Result Value Ref Range   Color, Urine AMBER (A) YELLOW   APPearance HAZY (A) CLEAR   Specific Gravity, Urine 1.041 (H) 1.005 - 1.030   pH 5.0 5.0 - 8.0   Glucose, UA NEGATIVE NEGATIVE mg/dL   Hgb urine dipstick NEGATIVE NEGATIVE   Bilirubin Urine MODERATE (A) NEGATIVE   Ketones, ur 5 (A) NEGATIVE mg/dL   Protein, ur 053 (A) NEGATIVE mg/dL   Nitrite NEGATIVE NEGATIVE   Leukocytes,Ua NEGATIVE NEGATIVE   RBC / HPF 11-20 0 - 5 RBC/hpf   WBC, UA 0-5 0 - 5 WBC/hpf   Bacteria, UA FEW (A) NONE SEEN   Squamous Epithelial / LPF 6-10 0 - 5   Mucus PRESENT    Budding Yeast PRESENT    Granular Casts, UA PRESENT   Protein / creatinine ratio, urine     Status: Abnormal   Collection Time: 10/04/19  1:23 PM  Result Value Ref Range   Creatinine, Urine 260.00 mg/dL   Total Protein, Urine 94 mg/dL   Protein Creatinine Ratio 0.36 (H) 0.00 - 0.15 mg/mg[Cre]    MDM Client has daughter with RSV and herself has started with congestion and a headache. BP is elevated and Pre-e labs done with elevated PR ration and normal liver function and platelets.  Urinalysis shows dehydration Consult with Dr. Earlene Plater - diagnosis of Preeclampsia due to elevated PR ratio, but client has a reason for headache due to URI.  Will give fluids and another dose of tylenol.  If headache is resolved, will plan on discharge.  Client has an appointment in the office on Friday for BP check. Contacted Dr. Mindi Slicker and reviewed new diagnosis of pre-eclampsia, recent BP readings and labs.  Client  currently is headache free and reviewed signs of worsening condition with her.  Assessment and Plan  High BP Headache URI Dehydration Catgory 1 FHT tracing  Plan Follow up in the office on Friday for BP reeval. Call the office if your symptoms are worsening or if headache is recurring or if you develop visual changes or RUQ pain. Drink at least 8 8-oz glasses of water every day. Plan to rest at home at least today and tomorrow until your congestion is improving. Expect your baby to be moving daily as it has been.    Collin Hendley L Emmert Roethler 10/04/2019, 1:38 PM

## 2019-10-04 NOTE — MAU Note (Signed)
rtn appt today- BP elevated.  Nasal congestion, runny nose. Daughter dx with RSV,  Took covid test today- was neg.  No fever, vomiting, diarrhea, no loss of taste or sense of smell.  +HA, denies visual changes, epigastric pain, or increase in swelling.

## 2019-10-07 DIAGNOSIS — O24414 Gestational diabetes mellitus in pregnancy, insulin controlled: Secondary | ICD-10-CM | POA: Diagnosis not present

## 2019-10-07 DIAGNOSIS — Z3A33 33 weeks gestation of pregnancy: Secondary | ICD-10-CM | POA: Diagnosis not present

## 2019-10-09 DIAGNOSIS — J329 Chronic sinusitis, unspecified: Secondary | ICD-10-CM | POA: Diagnosis not present

## 2019-10-11 ENCOUNTER — Ambulatory Visit (INDEPENDENT_AMBULATORY_CARE_PROVIDER_SITE_OTHER): Payer: BC Managed Care – PPO | Admitting: *Deleted

## 2019-10-11 ENCOUNTER — Inpatient Hospital Stay (HOSPITAL_COMMUNITY)
Admission: AD | Admit: 2019-10-11 | Discharge: 2019-10-14 | DRG: 788 | Disposition: A | Discharge status: Home / Self Care | Payer: BC Managed Care – PPO | Attending: Obstetrics and Gynecology | Admitting: Obstetrics and Gynecology

## 2019-10-11 ENCOUNTER — Inpatient Hospital Stay (HOSPITAL_COMMUNITY): Payer: BC Managed Care – PPO | Admitting: Anesthesiology

## 2019-10-11 ENCOUNTER — Encounter (HOSPITAL_COMMUNITY)
Admission: AD | Disposition: A | Discharge status: Home / Self Care | Payer: Self-pay | Source: Home / Self Care | Attending: Obstetrics and Gynecology

## 2019-10-11 ENCOUNTER — Inpatient Hospital Stay (HOSPITAL_COMMUNITY): Admit: 2019-10-11 | Payer: PRIVATE HEALTH INSURANCE | Admitting: Obstetrics and Gynecology

## 2019-10-11 ENCOUNTER — Other Ambulatory Visit: Payer: Self-pay

## 2019-10-11 ENCOUNTER — Encounter: Payer: Self-pay | Admitting: *Deleted

## 2019-10-11 ENCOUNTER — Encounter (HOSPITAL_COMMUNITY): Payer: Self-pay | Admitting: Obstetrics and Gynecology

## 2019-10-11 VITALS — BP 144/86 | HR 109 | Ht 60.0 in | Wt 194.0 lb

## 2019-10-11 DIAGNOSIS — O24414 Gestational diabetes mellitus in pregnancy, insulin controlled: Secondary | ICD-10-CM | POA: Diagnosis not present

## 2019-10-11 DIAGNOSIS — Z3A34 34 weeks gestation of pregnancy: Secondary | ICD-10-CM

## 2019-10-11 DIAGNOSIS — O1404 Mild to moderate pre-eclampsia, complicating childbirth: Principal | ICD-10-CM | POA: Diagnosis present

## 2019-10-11 DIAGNOSIS — R030 Elevated blood-pressure reading, without diagnosis of hypertension: Secondary | ICD-10-CM | POA: Diagnosis not present

## 2019-10-11 DIAGNOSIS — O43193 Other malformation of placenta, third trimester: Secondary | ICD-10-CM | POA: Diagnosis present

## 2019-10-11 DIAGNOSIS — O24425 Gestational diabetes mellitus in childbirth, controlled by oral hypoglycemic drugs: Secondary | ICD-10-CM | POA: Diagnosis not present

## 2019-10-11 DIAGNOSIS — F419 Anxiety disorder, unspecified: Secondary | ICD-10-CM | POA: Diagnosis present

## 2019-10-11 DIAGNOSIS — O34211 Maternal care for low transverse scar from previous cesarean delivery: Secondary | ICD-10-CM | POA: Diagnosis not present

## 2019-10-11 DIAGNOSIS — Z20822 Contact with and (suspected) exposure to covid-19: Secondary | ICD-10-CM | POA: Diagnosis not present

## 2019-10-11 DIAGNOSIS — J329 Chronic sinusitis, unspecified: Secondary | ICD-10-CM | POA: Diagnosis not present

## 2019-10-11 DIAGNOSIS — J90 Pleural effusion, not elsewhere classified: Secondary | ICD-10-CM | POA: Diagnosis not present

## 2019-10-11 DIAGNOSIS — O99892 Other specified diseases and conditions complicating childbirth: Secondary | ICD-10-CM | POA: Diagnosis not present

## 2019-10-11 DIAGNOSIS — O99344 Other mental disorders complicating childbirth: Secondary | ICD-10-CM | POA: Diagnosis present

## 2019-10-11 DIAGNOSIS — O99214 Obesity complicating childbirth: Secondary | ICD-10-CM | POA: Diagnosis present

## 2019-10-11 DIAGNOSIS — O1403 Mild to moderate pre-eclampsia, third trimester: Secondary | ICD-10-CM

## 2019-10-11 DIAGNOSIS — O1413 Severe pre-eclampsia, third trimester: Secondary | ICD-10-CM | POA: Diagnosis not present

## 2019-10-11 DIAGNOSIS — E669 Obesity, unspecified: Secondary | ICD-10-CM | POA: Diagnosis present

## 2019-10-11 DIAGNOSIS — O34219 Maternal care for unspecified type scar from previous cesarean delivery: Secondary | ICD-10-CM | POA: Diagnosis not present

## 2019-10-11 DIAGNOSIS — R0682 Tachypnea, not elsewhere classified: Secondary | ICD-10-CM | POA: Diagnosis not present

## 2019-10-11 DIAGNOSIS — O1414 Severe pre-eclampsia complicating childbirth: Secondary | ICD-10-CM | POA: Diagnosis not present

## 2019-10-11 LAB — COMPREHENSIVE METABOLIC PANEL
ALT: 18 U/L (ref 0–44)
AST: 14 U/L — ABNORMAL LOW (ref 15–41)
Albumin: 2.3 g/dL — ABNORMAL LOW (ref 3.5–5.0)
Alkaline Phosphatase: 162 U/L — ABNORMAL HIGH (ref 38–126)
Anion gap: 9 (ref 5–15)
BUN: 8 mg/dL (ref 6–20)
CO2: 18 mmol/L — ABNORMAL LOW (ref 22–32)
Calcium: 9.1 mg/dL (ref 8.9–10.3)
Chloride: 108 mmol/L (ref 98–111)
Creatinine, Ser: 0.7 mg/dL (ref 0.44–1.00)
GFR calc Af Amer: >60 mL/min (ref >60)
GFR calc non Af Amer: >60 mL/min (ref >60)
Glucose, Bld: 156 mg/dL — ABNORMAL HIGH (ref 70–99)
Potassium: 4.6 mmol/L (ref 3.5–5.1)
Sodium: 135 mmol/L (ref 135–145)
Total Bilirubin: 0.2 mg/dL — ABNORMAL LOW (ref 0.3–1.2)
Total Protein: 5.7 g/dL — ABNORMAL LOW (ref 6.5–8.1)

## 2019-10-11 LAB — CBC
HCT: 34.5 % — ABNORMAL LOW (ref 36.0–46.0)
Hemoglobin: 10.8 g/dL — ABNORMAL LOW (ref 12.0–15.0)
MCH: 24.9 pg — ABNORMAL LOW (ref 26.0–34.0)
MCHC: 31.3 g/dL (ref 30.0–36.0)
MCV: 79.5 fL — ABNORMAL LOW (ref 80.0–100.0)
Platelets: 329 10*3/uL (ref 150–400)
RBC: 4.34 MIL/uL (ref 3.87–5.11)
RDW: 16.7 % — ABNORMAL HIGH (ref 11.5–15.5)
WBC: 8.8 10*3/uL (ref 4.0–10.5)
nRBC: 0 % (ref 0.0–0.2)

## 2019-10-11 LAB — GLUCOSE, CAPILLARY
Glucose-Capillary: 125 mg/dL — ABNORMAL HIGH (ref 70–99)
Glucose-Capillary: 131 mg/dL — ABNORMAL HIGH (ref 70–99)

## 2019-10-11 LAB — TYPE AND SCREEN
ABO/RH(D): O POS
Antibody Screen: NEGATIVE

## 2019-10-11 LAB — SARS CORONAVIRUS 2 BY RT PCR (HOSPITAL ORDER, PERFORMED IN ~~LOC~~ HOSPITAL LAB): SARS Coronavirus 2: NEGATIVE

## 2019-10-11 LAB — PROTEIN / CREATININE RATIO, URINE
Creatinine, Urine: 122.86 mg/dL
Protein Creatinine Ratio: 1.38 mg/mg{Cre} — ABNORMAL HIGH (ref 0.00–0.15)
Total Protein, Urine: 169 mg/dL

## 2019-10-11 SURGERY — Surgical Case
Anesthesia: Spinal | Wound class: Clean Contaminated

## 2019-10-11 MED ORDER — FENTANYL CITRATE (PF) 100 MCG/2ML IJ SOLN: 25.0000 ug | INTRAMUSCULAR | Status: DC | PRN

## 2019-10-11 MED ORDER — FENTANYL CITRATE (PF) 100 MCG/2ML IJ SOLN
INTRAMUSCULAR | Status: DC | PRN
Start: 2019-10-11 — End: 2019-10-11
  Administered 2019-10-11: 15 ug via INTRATHECAL

## 2019-10-11 MED ORDER — MAGNESIUM SULFATE BOLUS VIA INFUSION
4.0000 g | Freq: Once | INTRAVENOUS | Status: AC
  Administered 2019-10-11: 4 g via INTRAVENOUS
  Filled 2019-10-11: qty 1000

## 2019-10-11 MED ORDER — MORPHINE SULFATE (PF) 0.5 MG/ML IJ SOLN
INTRAMUSCULAR | Status: AC
  Filled 2019-10-11: qty 10

## 2019-10-11 MED ORDER — ONDANSETRON HCL 4 MG/2ML IJ SOLN
INTRAMUSCULAR | Status: DC | PRN
  Administered 2019-10-11: 4 mg via INTRAVENOUS

## 2019-10-11 MED ORDER — SODIUM CHLORIDE 0.9 % IV SOLN: INTRAVENOUS | Status: DC | PRN

## 2019-10-11 MED ORDER — LACTATED RINGERS IV SOLN: INTRAVENOUS | Status: DC | PRN

## 2019-10-11 MED ORDER — BETAMETHASONE SOD PHOS & ACET 6 (3-3) MG/ML IJ SUSP
12.0000 mg | INTRAMUSCULAR | Status: DC
  Administered 2019-10-11: 12 mg via INTRAMUSCULAR

## 2019-10-11 MED ORDER — SOD CITRATE-CITRIC ACID 500-334 MG/5ML PO SOLN
ORAL | Status: AC
  Administered 2019-10-11: 15 mL
  Filled 2019-10-11: qty 30

## 2019-10-11 MED ORDER — ACETAMINOPHEN 160 MG/5ML PO SOLN: 1000.0000 mg | Freq: Once | ORAL | Status: DC

## 2019-10-11 MED ORDER — POVIDONE-IODINE 10 % EX SWAB
2.0000 "application " | Freq: Once | CUTANEOUS | Status: AC
  Administered 2019-10-11: 2 via TOPICAL

## 2019-10-11 MED ORDER — DEXAMETHASONE SODIUM PHOSPHATE 10 MG/ML IJ SOLN
INTRAMUSCULAR | Status: AC
  Filled 2019-10-11: qty 1

## 2019-10-11 MED ORDER — MAGNESIUM SULFATE 40 GM/1000ML IV SOLN
2.0000 g/h | INTRAVENOUS | Status: AC
  Administered 2019-10-12: 2 g/h via INTRAVENOUS
  Filled 2019-10-11 (×2): qty 1000

## 2019-10-11 MED ORDER — ACETAMINOPHEN 500 MG PO TABS: 1000.0000 mg | ORAL_TABLET | Freq: Once | ORAL | Status: DC

## 2019-10-11 MED ORDER — LACTATED RINGERS IV SOLN: Freq: Once | INTRAVENOUS | Status: AC

## 2019-10-11 MED ORDER — EPHEDRINE SULFATE-NACL 50-0.9 MG/10ML-% IV SOSY
PREFILLED_SYRINGE | INTRAVENOUS | Status: DC | PRN
  Administered 2019-10-11: 10 mg via INTRAVENOUS

## 2019-10-11 MED ORDER — CEFAZOLIN SODIUM-DEXTROSE 2-4 GM/100ML-% IV SOLN
2.0000 g | INTRAVENOUS | Status: AC
  Administered 2019-10-11: 2 g via INTRAVENOUS
  Filled 2019-10-11: qty 100

## 2019-10-11 MED ORDER — LABETALOL HCL 5 MG/ML IV SOLN: 40.0000 mg | INTRAVENOUS | Status: DC | PRN

## 2019-10-11 MED ORDER — LABETALOL HCL 5 MG/ML IV SOLN: 80.0000 mg | INTRAVENOUS | Status: DC | PRN

## 2019-10-11 MED ORDER — MORPHINE SULFATE (PF) 0.5 MG/ML IJ SOLN
INTRAMUSCULAR | Status: DC | PRN
Start: 2019-10-11 — End: 2019-10-11
  Administered 2019-10-11: .15 mg via INTRATHECAL

## 2019-10-11 MED ORDER — PHENYLEPHRINE HCL-NACL 20-0.9 MG/250ML-% IV SOLN
INTRAVENOUS | Status: DC | PRN
  Administered 2019-10-11: 60 ug/min via INTRAVENOUS

## 2019-10-11 MED ORDER — OXYTOCIN-SODIUM CHLORIDE 30-0.9 UT/500ML-% IV SOLN
INTRAVENOUS | Status: DC | PRN
  Administered 2019-10-11: 30 [IU] via INTRAVENOUS

## 2019-10-11 MED ORDER — LACTATED RINGERS IV SOLN: INTRAVENOUS | Status: DC

## 2019-10-11 MED ORDER — PROMETHAZINE HCL 25 MG/ML IJ SOLN: 6.2500 mg | INTRAMUSCULAR | Status: DC | PRN

## 2019-10-11 MED ORDER — SODIUM CHLORIDE 0.9 % IR SOLN
Status: DC | PRN
  Administered 2019-10-11 (×2): 1

## 2019-10-11 MED ORDER — STERILE WATER FOR IRRIGATION IR SOLN
Status: DC | PRN
  Administered 2019-10-11: 1000 mL

## 2019-10-11 MED ORDER — FENTANYL CITRATE (PF) 100 MCG/2ML IJ SOLN
INTRAMUSCULAR | Status: AC
  Filled 2019-10-11: qty 2

## 2019-10-11 MED ORDER — KETOROLAC TROMETHAMINE 30 MG/ML IJ SOLN: 30.0000 mg | Freq: Once | INTRAMUSCULAR | Status: DC

## 2019-10-11 MED ORDER — EPHEDRINE 5 MG/ML INJ
INTRAVENOUS | Status: AC
  Filled 2019-10-11: qty 10

## 2019-10-11 MED ORDER — ONDANSETRON HCL 4 MG/2ML IJ SOLN
INTRAMUSCULAR | Status: AC
  Filled 2019-10-11: qty 2

## 2019-10-11 MED ORDER — HYDRALAZINE HCL 20 MG/ML IJ SOLN: 10.0000 mg | INTRAMUSCULAR | Status: DC | PRN

## 2019-10-11 MED ORDER — OXYTOCIN-SODIUM CHLORIDE 30-0.9 UT/500ML-% IV SOLN
INTRAVENOUS | Status: AC
  Filled 2019-10-11: qty 500

## 2019-10-11 MED ORDER — BUPIVACAINE IN DEXTROSE 0.75-8.25 % IT SOLN
INTRATHECAL | Status: DC | PRN
  Administered 2019-10-11: 1.6 mL via INTRATHECAL

## 2019-10-11 MED ORDER — DEXAMETHASONE SODIUM PHOSPHATE 10 MG/ML IJ SOLN
INTRAMUSCULAR | Status: DC | PRN
  Administered 2019-10-11: 5 mg via INTRAVENOUS

## 2019-10-11 MED ORDER — LABETALOL HCL 5 MG/ML IV SOLN
20.0000 mg | INTRAVENOUS | Status: DC | PRN
  Administered 2019-10-11: 20 mg via INTRAVENOUS
  Filled 2019-10-11: qty 4

## 2019-10-11 SURGICAL SUPPLY — 32 items
CHLORAPREP W/TINT 26ML (MISCELLANEOUS) ×3 IMPLANT
CLAMP CORD UMBIL (MISCELLANEOUS) IMPLANT
CLOTH BEACON ORANGE TIMEOUT ST (SAFETY) ×3 IMPLANT
DERMABOND ADVANCED (GAUZE/BANDAGES/DRESSINGS) ×2
DERMABOND ADVANCED .7 DNX12 (GAUZE/BANDAGES/DRESSINGS) ×1 IMPLANT
DRSG OPSITE POSTOP 4X10 (GAUZE/BANDAGES/DRESSINGS) ×3 IMPLANT
ELECT REM PT RETURN 9FT ADLT (ELECTROSURGICAL) ×3
ELECTRODE REM PT RTRN 9FT ADLT (ELECTROSURGICAL) ×1 IMPLANT
EXTRACTOR VACUUM KIWI (MISCELLANEOUS) IMPLANT
EXTRACTOR VACUUM M CUP 4 TUBE (SUCTIONS) IMPLANT
EXTRACTOR VACUUM M CUP 4' TUBE (SUCTIONS)
GLOVE BIOGEL PI IND STRL 7.0 (GLOVE) ×1 IMPLANT
GLOVE BIOGEL PI INDICATOR 7.0 (GLOVE) ×2
GLOVE ORTHO TXT STRL SZ7.5 (GLOVE) ×3 IMPLANT
GOWN STRL REUS W/TWL LRG LVL3 (GOWN DISPOSABLE) ×6 IMPLANT
KIT ABG SYR 3ML LUER SLIP (SYRINGE) IMPLANT
NEEDLE HYPO 25X5/8 SAFETYGLIDE (NEEDLE) ×3 IMPLANT
NS IRRIG 1000ML POUR BTL (IV SOLUTION) ×3 IMPLANT
PACK C SECTION WH (CUSTOM PROCEDURE TRAY) ×3 IMPLANT
PAD OB MATERNITY 4.3X12.25 (PERSONAL CARE ITEMS) ×3 IMPLANT
PENCIL SMOKE EVAC W/HOLSTER (ELECTROSURGICAL) ×3 IMPLANT
RTRCTR C-SECT PINK 25CM LRG (MISCELLANEOUS) ×3 IMPLANT
SUT CHROMIC 1 CTX 36 (SUTURE) ×6 IMPLANT
SUT PLAIN 0 NONE (SUTURE) IMPLANT
SUT PLAIN 2 0 XLH (SUTURE) ×6 IMPLANT
SUT VIC AB 0 CT1 27 (SUTURE) ×4
SUT VIC AB 0 CT1 27XBRD ANBCTR (SUTURE) ×2 IMPLANT
SUT VIC AB 2-0 CT1 (SUTURE) ×3 IMPLANT
SUT VIC AB 4-0 KS 27 (SUTURE) IMPLANT
TOWEL OR 17X24 6PK STRL BLUE (TOWEL DISPOSABLE) ×3 IMPLANT
TRAY FOLEY W/BAG SLVR 14FR LF (SET/KITS/TRAYS/PACK) ×3 IMPLANT
WATER STERILE IRR 1000ML POUR (IV SOLUTION) ×3 IMPLANT

## 2019-10-11 NOTE — Progress Notes (Signed)
Betamethasone 12 mg IM administered as scheduled. Pt tolerated well. Dose #2 scheduled tomorrow. Pt has office appt for NST immediately following injection today.

## 2019-10-11 NOTE — Progress Notes (Signed)
Patient doing well, still asymptomatic BP 138/90 (BP Location: Left Arm)   Pulse (!) 110   Temp 98.8 F (37.1 C) (Oral)   Resp 18   Ht 5' (1.524 m)   Wt 88.9 kg   SpO2 100%   BMI 38.26 kg/m  Cat 1 tracing Husband in room, explained reasoning for section, all questions answered. NICU aware of pt.

## 2019-10-11 NOTE — Anesthesia Procedure Notes (Signed)
Spinal  Patient location during procedure: OR Start time: 10/11/2019 9:16 PM End time: 10/11/2019 9:19 PM Staffing Performed: anesthesiologist  Anesthesiologist: Kaylyn Layer, MD Preanesthetic Checklist Completed: patient identified, IV checked, risks and benefits discussed, monitors and equipment checked, pre-op evaluation and timeout performed Spinal Block Patient position: sitting Prep: DuraPrep and site prepped and draped Patient monitoring: heart rate, continuous pulse ox and blood pressure Approach: midline Location: L3-4 Injection technique: single-shot Needle Needle type: Pencan  Needle gauge: 24 G Needle length: 10 cm Assessment Sensory level: T4 Additional Notes Risks, benefits, and alternative discussed. Patient gave consent to procedure. Prepped and draped in sitting position. Clear CSF obtained after one needle pass. Positive terminal aspiration. No pain or paraesthesias with injection. Patient tolerated procedure well. Vital signs stable. Amalia Greenhouse, MD

## 2019-10-11 NOTE — MAU Provider Note (Signed)
History     CSN: [id number]  Arrival date and time: 10/11/19 1305   First Provider Initiated Contact with Patient 10/11/19 1341      Chief Complaint  Patient presents with  . Hypertension   HPI Brittany Werner is a 39 y.o. G2P1001 at [redacted]w[redacted]d who presents to MAU from clinic for evaluation of elevated blood pressure in the setting of previously diagnosed Preeclampsia. Patient states she presented to clinic for an NST. During her NST her vitals were collected and she was told that her blood pressure was elevated. Then the clinic staff member took a manual blood pressure reading and the patient was told "that was really high". The staff member spoke to the Provider in office and the patient was advised to report to MAU for further evaluation.  On arrival to MAU she denies headache, visual disturbances, RUQ/epigastric pain, new onset swelling or weight gain. She also denies vaginal bleeding, leaking of fluid, decreased fetal movement, fever, falls, or recent illness.   Patient is s/p Betamethasone 1 of 2 this morning. [facility]. Patient is planning repeat cesarean section. She states she ate a cheeseburger at 1130 between clinic and MAU.  OB History    Gravida  2   Para  1   Term  1   Preterm      AB      Living  1     SAB      TAB      Ectopic      Multiple  0   Live Births  1           Past Medical History:  Diagnosis Date  . PMDD (premenstrual dysphoric disorder)   . Sleep apnea     Past Surgical History:  Procedure Laterality Date  . CESAREAN SECTION N/A 12/26/2016   Procedure: CESAREAN SECTION;  Surgeon: Lavina Hamman, MD;  Location: [facility] BIRTHING SUITES;  Service: Obstetrics;  Laterality: N/A;  . chin surgery    . EYE SURGERY    . UTERINE FIBROID SURGERY      Family History  Problem Relation Age of Onset  . Cancer Mother        Breast  . Hypertension Brother   . Diabetes Maternal Grandmother   . Hypertension Maternal  Grandfather   . Heart attack Father     Social History   Tobacco Use  . Smoking status: Never Smoker  . Smokeless tobacco: Never Used  Vaping Use  . Vaping Use: Never used  Substance Use Topics  . Alcohol use: No    Comment: occasionally  . Drug use: No    Allergies:  Allergies  Allergen Reactions  . Sulfa Antibiotics Swelling    Of the lips    Facility-Administered Medications Prior to Admission  Medication Dose Route Frequency Provider Last Rate Last Admin  . betamethasone acetate-betamethasone sodium phosphate (CELESTONE) injection 12 mg  12 mg Intramuscular Q24 Hr x 2 Meisinger, Todd, MD   12 mg at 10/11/19 1103   Medications Prior to Admission  Medication Sig Dispense Refill Last Dose  . acetaminophen (TYLENOL) 500 MG tablet Take 1,000 mg by mouth every 6 (six) hours as needed.     Marland Kitchen amoxicillin (AMOXIL) 875 MG tablet Take 875 mg by mouth 2 (two) times daily.     Marland Kitchen buPROPion (WELLBUTRIN XL) 300 MG 24 hr tablet Take 300 mg by mouth daily.     . calcium carbonate (TUMS - DOSED IN MG ELEMENTAL  CALCIUM) 500 MG chewable tablet Chew 2 tablets 3 (three) times daily as needed by mouth for indigestion or heartburn.     . docusate sodium (COLACE) 100 MG capsule Take 100 mg by mouth 2 (two) times daily.     . famotidine (PEPCID) 20 MG tablet Take 20 mg by mouth 2 (two) times daily.     . ferrous sulfate 325 (65 FE) MG tablet Take 325 mg by mouth daily with breakfast.     . fluticasone (FLONASE) 50 MCG/ACT nasal spray Place 2 sprays daily into both nostrils.      . insulin aspart (NOVOLOG) 100 UNIT/ML injection Inject 10 Units into the skin at bedtime.     . metFORMIN (GLUCOPHAGE) 1000 MG tablet Take 1,000 mg by mouth at bedtime.     . Prenatal Vit-Fe Fumarate-FA (PRENATAL MULTIVITAMIN) TABS tablet Take 1 tablet at bedtime by mouth. 100 tablet 3     Review of Systems  Eyes: Negative for photophobia and visual disturbance.  Gastrointestinal: Negative for abdominal pain.   Genitourinary: Negative for vaginal bleeding.  Musculoskeletal: Negative for back pain.  Neurological: Negative for headaches.  All other systems reviewed and are negative.  Physical Exam   Blood pressure (!) 165/100, pulse (!) 125, temperature 99 F (37.2 C), temperature source Oral, resp. rate 20, height 5' (1.524 m), weight 88.9 kg, unknown if currently breastfeeding.  Physical Exam Vitals and nursing note reviewed. Exam conducted with a chaperone present.  Constitutional:      General: She is not in acute distress.    Appearance: Normal appearance. She is diaphoretic. She is not ill-appearing or toxic-appearing.  Cardiovascular:     Rate and Rhythm: Tachycardia present.     Pulses: Normal pulses.     Heart sounds: Normal heart sounds.  Pulmonary:     Effort: Pulmonary effort is normal.     Breath sounds: Normal breath sounds.  Abdominal:     Comments: Gravid  Genitourinary:    Comments: Cervical exam deferred due to absence of pain, quiet toco Skin:    Capillary Refill: Capillary refill takes less than 2 seconds.  Neurological:     General: No focal deficit present.     Mental Status: She is alert.  Psychiatric:        Mood and Affect: Mood normal.     MAU Course  Procedures  --Reactive tracing: baseline 150, mod var, + 15 x 15 accels, no decels\  --Toco: occasional UI, otherwise quiet  --Labetalol Protocol ordered after severe range blood pressure x 1. Initiated after second severe range blood pressure per MAU protocol.  --Dr. Reina Fuse notified of PEC with SF at 1510, immediately after P:Cr result at 1508. Per  Dr. Reina Fuse, initiate MGSO4, prep for OR  Orders Placed This Encounter  Procedures  . Comprehensive metabolic panel  . CBC  . Protein / creatinine ratio, urine  . RPR  . Diet NPO time specified  . Notify Physician  . Measure blood pressure  . Type and screen   Patient Vitals for the past 24 hrs:  BP Temp Temp src Pulse Resp SpO2 Height Weight   10/11/19 1500 (!) 157/117 -- -- (!) 109 -- 98 % -- --  10/11/19 1445 (!) 159/108 -- -- (!) 102 -- 97 % -- --  10/11/19 1430 (!) 155/104 -- -- (!) 110 -- 98 % -- --  10/11/19 1425 -- -- -- -- -- 99 % -- --  10/11/19 1415 (!) 151/96 -- -- (!) 112 -- -- -- --  10/11/19 1400 (!) 148/93 -- -- (!) 114 -- -- -- --  10/11/19 1346 (!) 154/105 -- -- (!) 112 -- -- -- --  10/11/19 1317 (!) 165/100 99 F (37.2 C) Oral (!) 125 20 -- 5' (1.524 m) 88.9 kg   Results for orders placed or performed during the hospital encounter of 10/11/19 (from the past 24 hour(s))  Protein / creatinine ratio, urine     Status: Abnormal   Collection Time: 10/11/19  1:24 PM  Result Value Ref Range   Creatinine, Urine 122.86 mg/dL   Total Protein, Urine 169 mg/dL   Protein Creatinine Ratio 1.38 (H) 0.00 - 0.15 mg/mg[Cre]  CBC     Status: Abnormal   Collection Time: 10/11/19  2:27 PM  Result Value Ref Range   WBC 8.8 4.0 - 10.5 K/uL   RBC 4.34 3.87 - 5.11 MIL/uL   Hemoglobin 10.8 (L) 12.0 - 15.0 g/dL   HCT 38.1 (L) 36 - 46 %   MCV 79.5 (L) 80.0 - 100.0 fL   MCH 24.9 (L) 26.0 - 34.0 pg   MCHC 31.3 30.0 - 36.0 g/dL   RDW 01.7 (H) 51.0 - 25.8 %   Platelets 329 150 - 400 K/uL   nRBC 0.0 0.0 - 0.2 %  Type and screen     Status: None (Preliminary result)   Collection Time: 10/11/19  2:27 PM  Result Value Ref Range   ABO/RH(D) PENDING    Antibody Screen PENDING    Sample Expiration      10/14/2019,2359 Performed at [facility], [address], [place], [place] [address]    Assessment and Plan  --39 y.o. G2P1001 at [redacted]w[redacted]d  --Cat 1 tracing --PEC with SF --Magnesium Sulfate initiated in MAU --A2GDM on Metformin --NPO since 1130 today --Per Dr. Reina Fuse, prep for OR  Calvert Cantor, CNM 10/11/2019, 7:07 PM

## 2019-10-11 NOTE — Anesthesia Postprocedure Evaluation (Signed)
Anesthesia Post Note  Patient: Brittany Werner  Procedure(s) Performed: CESAREAN SECTION (N/A )     Patient location during evaluation: PACU Anesthesia Type: Spinal Level of consciousness: awake and alert and oriented Pain management: pain level controlled Vital Signs Assessment: post-procedure vital signs reviewed and stable Respiratory status: spontaneous breathing, nonlabored ventilation and respiratory function stable Cardiovascular status: blood pressure returned to baseline Postop Assessment: no apparent nausea or vomiting, spinal receding, no headache and no backache Anesthetic complications: no   No complications documented.  Last Vitals:  Vitals:   10/11/19 2315 10/11/19 2330  BP: 121/75 99/79  Pulse: (!) 101 (!) 105  Resp: 18 15  Temp:  36.4 C  SpO2: 98% 96%    Last Pain:  Vitals:   10/11/19 2330  TempSrc:   PainSc: 0-No pain   Pain Goal:    LLE Motor Response: Purposeful movement (10/11/19 2330) LLE Sensation: Tingling (10/11/19 2330) RLE Motor Response: Purposeful movement (10/11/19 2330) RLE Sensation: Tingling (10/11/19 2330)     Epidural/Spinal Function Cutaneous sensation: Able to Discern Pressure (10/11/19 2330), Patient able to flex knees: Yes (10/11/19 2330), Patient able to lift hips off bed: No (10/11/19 2330), Back pain beyond tenderness at insertion site: No (10/11/19 2330), Progressively worsening motor and/or sensory loss: No (10/11/19 2330), Bowel and/or bladder incontinence post epidural: No (10/11/19 2330)  Kaylyn Layer

## 2019-10-11 NOTE — Op Note (Signed)
C-Section Operative Note  Date: 10/11/19  Preoperative Diagnosis:IUP @ 34 3/7, PreE w/ SF Postoperative Diagnosis: Same as above Procedure: Vacuum-assisted ERLTCS Indications: H/o csx x1, declining TOLAC Surgeon: Eula Flax, MD Assist: Lyda Kalata, MD Findings: Viable female infant weighing 2710g with APGARS of 10 and 10 at 1 and 5 minutes, respectively. Normal appearing uterus, bilateral fallopian tubes and ovaries. Specimens: Placenta to pathology EBL 542cc IVF 1600 UOP 150  Patient Course: Patient sent to MAU at 34 3/7 for evaluation of SRBP. Noted persistence, required IV labetalol to control. Me criteria for PreE on 8/23, now with severe features. H/o csx x1, declining TOLAC. Last ate at 1130. Reviewed need for MgSO4 for seizure ppx; s/p BMTZ x1  Consent:  R/B/A of cesarean section discussed with patient. Alternative would be vaginal delivery which would mean shorter postpartum stay and decreased risk of bleeding. Risks of section include infection of the uterus, pelvic organs, or skin, inadvertent injury to internal organs, such as bowel or bladder. If there is major injury, extensive surgery may be required. If injury is minor, it may be treated with relative ease. Discussed possibility of excessive blood loss and transfusion. If bleeding cannot be controlled using medical or minor surgical methods, a cesarean hysterectomy may be performed which would mean no future fertility. Patient accepts the possibility of blood transfusion, if necessary. Patient understands and agrees to move forward with section.   Operative Procedure: Patient was taken to the operating room where epidural anesthesia was found to be adequate by Allis clamp test. She was prepped and draped in the normal sterile fashion in the dorsal supine position with a leftward tilt. An appropriate time out was performed. A Pfannenstiel skin incision was then made with the scalpel and carried through to the underlying  layer of fascia by sharp dissection and Bovie cautery. The fascia was nicked in the midline and the incision was extended laterally with Mayo scissors. The superior aspect of the incision was grasped Coker clamps and dissected off the underlying rectus muscles. In a similar fashion the inferior aspect was dissected off the rectus muscles. Rectus muscles were separated in the midline and the peritoneal cavity entered using Kelly clamps and Metzenbaum scissors. The peritoneal incision was then extended both superiorly and inferiorly with careful attention to avoid both bowel and bladder. The Alexis self-retaining wound retractor was then placed within the incision and the lower uterine segment exposed. The bladder flap was developed with Metzenbaum scissors and pushed away from the lower uterine segment. The lower uterine segment was then incised in a low transverse fashion and the cavity itself entered bluntly. The incision was extended bluntly. Amniotic sac was ruptured and fluid was noted to be clear in color. The infant's head was then lifted and attempted to be delivered from the incision. Despite adequate fundal, resistance met. Hysterotomy extended 1cm BL with bandage scissors, still unable to be delivered. Mity vac applied with 1 gentle pull; able to deliver fetal occiput with aid of fundal pressure. Loose nuchal x1, easily reduced. The remainder of the infant delivered and the nose and mouth bulb suctioned with the cord clamped and cut as well. The infant was handed off to NICU. The placenta was then spontaneously expressed from the uterus and the uterus cleared of all clots and debris with moist lap sponge. Placenta noted to be bilobed in nature.The uterine incision was then repaired in 2 layers the first layer was a running locked layer of 0-vicryl and the second an imbricating  layer of the same suture. The tubes and ovaries were inspected and the gutters cleared of all clots and debris. The uterine  incision was inspected and found to be hemostatic. All instruments and sponges as well as the Alexis retractor were then removed from the abdomen. The peritoneum was then reapproximated using 2-0 suture. The rectus muscles were then reapproximated with several interrupted mattress sutures of 2-0 plain. The fascia was then closed with 0 Vicryl in a running fashion. Subcutaneous tissue was reapproximated with 3-0 plain in a running fashion. The skin was closed with a subcuticular stitch of 4-0 Vicryl on a Keith needle and then reinforced with Dermabond and a Honeycomb. At the conclusion of the procedure all instruments and sponge counts were correct. Patient was taken to the recovery room in good condition. Infant taken to NICU.

## 2019-10-11 NOTE — H&P (Signed)
Brittany Werner is a 39 y.o. female presenting for first BMTZ shot. Went to office for NST today for GDM/IVF pregnancy which was reactive but BP elevated with systolics in 160s. Sent to MAU for BP trending and labs. Denies PreE symptoms at this time. Already a known precclamptic with uPC 0.36 8/23.   PNC c/b IVF (donor egg and sperm), anxiety on bupropion, AMA, GDM on PO metformin 1000mg  QHS and 10u NPH PM, h/o csx x1 (declining TOLAC). Already scheduled for repeat section at 37wks  Currently taking amoxicillin for sinus infection,denies fevers chills or purulent drainage but feels congested. Also has sleep apnea but had no issues with anesthesia during first section  OB History    Gravida  2   Para  1   Term  1   Preterm      AB      Living  1     SAB      TAB      Ectopic      Multiple  0   Live Births  1          Past Medical History:  Diagnosis Date  . PMDD (premenstrual dysphoric disorder)   . Sleep apnea    Past Surgical History:  Procedure Laterality Date  . CESAREAN SECTION N/A 12/26/2016   Procedure: CESAREAN SECTION;  Surgeon: 12/28/2016, MD;  Location: [facility] BIRTHING SUITES;  Service: Obstetrics;  Laterality: N/A;  . chin surgery    . EYE SURGERY    . UTERINE FIBROID SURGERY     Family History: family history includes Cancer in her mother; Diabetes in her maternal grandmother; Heart attack in her father; Hypertension in her brother and maternal grandfather. Social History:  reports that she has never smoked. She has never used smokeless tobacco. She reports that she does not drink alcohol and does not use drugs.     Maternal Diabetes: Yes:  Diabetes Type:  Insulin/Medication controlled 1hr 190, 3hr 90, 189, 166, 90 Genetic Screening: Normal Maternal Ultrasounds/Referrals: Normal Fetal Ultrasounds or other Referrals:  None Maternal Substance Abuse:  No Significant Maternal Medications:  Meds include: Other: NPH, buproprion Significant Maternal  Lab Results:  None Other Comments:  None  Review of Systems  Constitutional: Negative for chills and fever.  HENT: Positive for congestion and sinus pressure. Negative for sore throat, trouble swallowing and voice change.   Respiratory: Negative for shortness of breath.   Cardiovascular: Negative for chest pain, palpitations and leg swelling.  Gastrointestinal: Negative for abdominal pain, nausea and vomiting.  Genitourinary: Negative for vaginal bleeding, vaginal discharge and vaginal pain.  Neurological: Negative for dizziness, weakness and headaches.  Psychiatric/Behavioral: Negative for suicidal ideas.   Maternal Medical History:  Reason for admission: Nausea.      Blood pressure (!) 140/94, pulse (!) 103, temperature 98.2 F (36.8 C), temperature source Oral, resp. rate 16, height 5' (1.524 m), weight 88.9 kg, SpO2 98 %, unknown if currently breastfeeding. Exam Physical Exam Constitutional:      General: She is not in acute distress.    Appearance: She is well-developed.  HENT:     Head: Normocephalic and atraumatic.  Eyes:     Pupils: Pupils are equal, round, and reactive to light.  Cardiovascular:     Rate and Rhythm: Normal rate and regular rhythm.     Heart sounds: No murmur heard.  No gallop.   Abdominal:     Tenderness: There is no abdominal tenderness. There is no guarding or rebound.  Genitourinary:    Vagina: Normal.  Musculoskeletal:        General: Normal range of motion.     Cervical back: Normal range of motion and neck supple.  Skin:    General: Skin is warm and dry.  Neurological:     Mental Status: She is alert and oriented to person, place, and time.     Prenatal labs: ABO, Rh: --/--/O POS (08/30 1427) Antibody: NEG (08/30 1427) Rubella:  imm RPR:   nr HBsAg:   neg HIV:   nr GBS:   unk  Assessment/Plan: This is a 39yo G2P1001 @ 34 3/7 by IVF who presents to MAU for eval of SRBP. Found to have persistent SRBP requiring IV labetalol,  responding well and asymptomatic. Meets criteria no for PreE w. SF> Given EGA, meeting criteria for delivery. Received first dose BMTZ during this MAU visit. NICU aware of patient. MgSO4 4g bolus, 2g maintenance for seizure ppx.  GDM - metformin 1000mg  QHS< 10u nPH QHS.  AMA, IVF pregnancy H/o csx x1 - declining TOLAC, desires ERLTCS, declining BTL Last PO intake 1130 (cheeseburger). Anesthesia aware, NPO at this time, will schedule approx 2100 (another section ahead). OK to wait as long as patient remains asymptomatic.   Currently with catagory 1 tracing, BP 140/94  R/B/A of cesarean section discussed with patient. Alternative would be vaginal delivery which would mean shorter postpartum stay and decreased risk of bleeding. Risks of section include infection of the uterus, pelvic organs, or skin, inadvertent injury to internal organs, such as bowel or bladder. If there is major injury, extensive surgery may be required. If injury is minor, it may be treated with relative ease. Discussed possibility of excessive blood loss and transfusion. If bleeding cannot be controlled using medical or minor surgical methods, a cesarean hysterectomy may be performed which would mean no future fertility. Patient accepts the possibility of blood transfusion, if necessary. Patient understands and agrees to move forward with section.  [address] Brittany Werner 10/11/2019, 4:30 PM

## 2019-10-11 NOTE — MAU Note (Signed)
Went to clinic, rec'd first BMZ. Reactive NST.  BP was up, not on meds. Denies HA, visual changes, epigastric pain or increase in swelling.  Was started on antibiotics Sat for sinus infections.

## 2019-10-11 NOTE — Anesthesia Preprocedure Evaluation (Addendum)
Anesthesia Evaluation  Patient identified by MRN, date of birth, ID band Patient awake    Reviewed: Allergy & Precautions, NPO status , Patient's Chart, lab work & pertinent test results  History of Anesthesia Complications Negative for: history of anesthetic complications  Airway Mallampati: II  TM Distance: >3 FB Neck ROM: Full    Dental no notable dental hx.    Pulmonary sleep apnea ,    Pulmonary exam normal        Cardiovascular hypertension, Pt. on medications Normal cardiovascular exam  Preeclampsia on MgSO4 and Labetalol   Neuro/Psych PSYCHIATRIC DISORDERS Depression    GI/Hepatic   Endo/Other  diabetes, Gestational, Oral Hypoglycemic Agents, Insulin DependentObesity BMI 38  Renal/GU      Musculoskeletal   Abdominal   Peds  Hematology  (+) anemia ,   Anesthesia Other Findings   Reproductive/Obstetrics (+) Pregnancy                          Anesthesia Physical Anesthesia Plan  ASA: III  Anesthesia Plan: Spinal   Post-op Pain Management:    Induction:   PONV Risk Score and Plan: 3 and Ondansetron, Dexamethasone and Treatment may vary due to age or medical condition  Airway Management Planned: Natural Airway  Additional Equipment:   Intra-op Plan:   Post-operative Plan:   Informed Consent: I have reviewed the patients History and Physical, chart, labs and discussed the procedure including the risks, benefits and alternatives for the proposed anesthesia with the patient or authorized representative who has indicated his/her understanding and acceptance.       Plan Discussed with: CRNA  Anesthesia Plan Comments: (Ate a cheeseburger at ~1115am. Plan for 2115 C-section as there is another C-section scheduled ahead of her.)     Anesthesia Quick Evaluation

## 2019-10-11 NOTE — Transfer of Care (Signed)
Immediate Anesthesia Transfer of Care Note  Patient: Brittany Werner  Procedure(s) Performed: CESAREAN SECTION (N/A )  Patient Location: PACU  Anesthesia Type:Spinal  Level of Consciousness: awake, alert  and oriented  Airway & Oxygen Therapy: Patient Spontanous Breathing  Post-op Assessment: Report given to RN and Post -op Vital signs reviewed and stable  Post vital signs: Reviewed and stable  Last Vitals:  Vitals Value Taken Time  BP 112/70 10/11/19 2249  Temp    Pulse 99 10/11/19 2251  Resp 10 10/11/19 2251  SpO2 99 % 10/11/19 2251  Vitals shown include unvalidated device data.  Last Pain:  Vitals:   10/11/19 1949  TempSrc: Oral  PainSc:          Complications: No complications documented.

## 2019-10-12 ENCOUNTER — Encounter (HOSPITAL_COMMUNITY): Payer: Self-pay | Admitting: Obstetrics and Gynecology

## 2019-10-12 DIAGNOSIS — O1413 Severe pre-eclampsia, third trimester: Secondary | ICD-10-CM | POA: Diagnosis present

## 2019-10-12 LAB — CBC
HCT: 30 % — ABNORMAL LOW (ref 36.0–46.0)
Hemoglobin: 9.2 g/dL — ABNORMAL LOW (ref 12.0–15.0)
MCH: 24.6 pg — ABNORMAL LOW (ref 26.0–34.0)
MCHC: 30.7 g/dL (ref 30.0–36.0)
MCV: 80.2 fL (ref 80.0–100.0)
Platelets: 385 10*3/uL (ref 150–400)
RBC: 3.74 MIL/uL — ABNORMAL LOW (ref 3.87–5.11)
RDW: 16.8 % — ABNORMAL HIGH (ref 11.5–15.5)
WBC: 18.9 10*3/uL — ABNORMAL HIGH (ref 4.0–10.5)
nRBC: 0 % (ref 0.0–0.2)

## 2019-10-12 LAB — GLUCOSE, CAPILLARY
Glucose-Capillary: 151 mg/dL — ABNORMAL HIGH (ref 70–99)
Glucose-Capillary: 132 mg/dL — ABNORMAL HIGH (ref 70–99)
Glucose-Capillary: 100 mg/dL — ABNORMAL HIGH (ref 70–99)

## 2019-10-12 LAB — RPR: RPR Ser Ql: NONREACTIVE

## 2019-10-12 MED ORDER — PRENATAL MULTIVITAMIN CH
1.0000 | ORAL_TABLET | Freq: Every day | ORAL | Status: DC
  Administered 2019-10-12 – 2019-10-14 (×3): 1 via ORAL
  Filled 2019-10-12 (×3): qty 1

## 2019-10-12 MED ORDER — OXYTOCIN-SODIUM CHLORIDE 30-0.9 UT/500ML-% IV SOLN: 2.5000 [IU]/h | INTRAVENOUS | Status: AC

## 2019-10-12 MED ORDER — SIMETHICONE 80 MG PO CHEW
80.0000 mg | CHEWABLE_TABLET | ORAL | Status: DC
  Administered 2019-10-12 – 2019-10-14 (×3): 80 mg via ORAL
  Filled 2019-10-12 (×3): qty 1

## 2019-10-12 MED ORDER — MENTHOL 3 MG MT LOZG: 1.0000 | LOZENGE | OROMUCOSAL | Status: DC | PRN

## 2019-10-12 MED ORDER — DIPHENHYDRAMINE HCL 25 MG PO CAPS
25.0000 mg | ORAL_CAPSULE | Freq: Four times a day (QID) | ORAL | Status: DC | PRN
  Administered 2019-10-12 – 2019-10-13 (×2): 25 mg via ORAL
  Filled 2019-10-12 (×2): qty 1

## 2019-10-12 MED ORDER — ACETAMINOPHEN 500 MG PO TABS
1000.0000 mg | ORAL_TABLET | Freq: Four times a day (QID) | ORAL | Status: DC
  Administered 2019-10-12 – 2019-10-14 (×10): 1000 mg via ORAL
  Filled 2019-10-12 (×11): qty 2

## 2019-10-12 MED ORDER — SENNOSIDES-DOCUSATE SODIUM 8.6-50 MG PO TABS
2.0000 | ORAL_TABLET | ORAL | Status: DC
  Administered 2019-10-12 – 2019-10-14 (×3): 2 via ORAL
  Filled 2019-10-12 (×3): qty 2

## 2019-10-12 MED ORDER — SIMETHICONE 80 MG PO CHEW
80.0000 mg | CHEWABLE_TABLET | Freq: Three times a day (TID) | ORAL | Status: DC
  Administered 2019-10-12 – 2019-10-14 (×8): 80 mg via ORAL
  Filled 2019-10-12 (×8): qty 1

## 2019-10-12 MED ORDER — DIBUCAINE (PERIANAL) 1 % EX OINT: 1.0000 "application " | TOPICAL_OINTMENT | CUTANEOUS | Status: DC | PRN

## 2019-10-12 MED ORDER — BUPROPION HCL ER (XL) 300 MG PO TB24
300.0000 mg | ORAL_TABLET | Freq: Every day | ORAL | Status: DC
  Administered 2019-10-12 – 2019-10-14 (×3): 300 mg via ORAL
  Filled 2019-10-12 (×3): qty 1

## 2019-10-12 MED ORDER — WITCH HAZEL-GLYCERIN EX PADS: 1.0000 "application " | MEDICATED_PAD | CUTANEOUS | Status: DC | PRN

## 2019-10-12 MED ORDER — FLUTICASONE PROPIONATE 50 MCG/ACT NA SUSP
2.0000 | Freq: Every day | NASAL | Status: DC
  Administered 2019-10-12 – 2019-10-14 (×3): 2 via NASAL
  Filled 2019-10-12: qty 16

## 2019-10-12 MED ORDER — KETOROLAC TROMETHAMINE 30 MG/ML IJ SOLN
30.0000 mg | Freq: Four times a day (QID) | INTRAMUSCULAR | Status: AC
  Administered 2019-10-12 (×4): 30 mg via INTRAVENOUS
  Filled 2019-10-12 (×4): qty 1

## 2019-10-12 MED ORDER — COCONUT OIL OIL: 1.0000 "application " | TOPICAL_OIL | Status: DC | PRN

## 2019-10-12 MED ORDER — LACTATED RINGERS IV SOLN: INTRAVENOUS | Status: DC

## 2019-10-12 MED ORDER — SIMETHICONE 80 MG PO CHEW: 80.0000 mg | CHEWABLE_TABLET | ORAL | Status: DC | PRN

## 2019-10-12 MED ORDER — IBUPROFEN 800 MG PO TABS
800.0000 mg | ORAL_TABLET | Freq: Four times a day (QID) | ORAL | Status: DC
  Administered 2019-10-13 – 2019-10-14 (×7): 800 mg via ORAL
  Filled 2019-10-12 (×7): qty 1

## 2019-10-12 MED ORDER — OXYCODONE HCL 5 MG PO TABS: 5.0000 mg | ORAL_TABLET | ORAL | Status: DC | PRN

## 2019-10-12 MED ORDER — TETANUS-DIPHTH-ACELL PERTUSSIS 5-2.5-18.5 LF-MCG/0.5 IM SUSP: 0.5000 mL | Freq: Once | INTRAMUSCULAR | Status: DC

## 2019-10-12 MED ORDER — ZOLPIDEM TARTRATE 5 MG PO TABS: 5.0000 mg | ORAL_TABLET | Freq: Every evening | ORAL | Status: DC | PRN

## 2019-10-12 NOTE — Progress Notes (Signed)
Patient ID: Brittany Werner, female   DOB: 13-Oct-1980, 39 y.o.   MRN: [id number] Chart check  Pt off MgSO4  BP 127/72 ( 126-127/72-78),   Plan : routine pp care

## 2019-10-12 NOTE — Progress Notes (Signed)
Patient ID: Brittany Werner, female   DOB: 08/01/1980, 39 y.o.   MRN: [id number] Pt sleeping soundly but easily arousable. She denies HA, CP or SOB. She had a sinus infection prior to delivery - only completed 2-3 days of augmentin. She states pain well controlled. Lochia mild. Baby doing well in NICU per pt.  VS:  11/51, 82 GEN - NAD ABD - FF, dressing c/d/i EXT - scds in place; no homans  18.9>9.2<385 GLuc : 151 ( 8am today)  A/P: 03TC [id number] female on POD#1 s/p repeat ltc/s for preE with SF        - BP currently stable; on MgSO4 for seizure prophylaxis - stop at 1030pm 8/31        - GDM - stopped PO metformin and insulin pp. Rcvd BMZ x1 preop - monitor        - continue routine pp care

## 2019-10-12 NOTE — Progress Notes (Signed)
Patient seen and assessed by nursing staff.  Agree with documentation and plan.  

## 2019-10-13 ENCOUNTER — Ambulatory Visit: Payer: PRIVATE HEALTH INSURANCE

## 2019-10-13 LAB — GLUCOSE, CAPILLARY: Glucose-Capillary: 86 mg/dL (ref 70–99)

## 2019-10-13 LAB — SURGICAL PATHOLOGY

## 2019-10-13 MED ORDER — FERROUS SULFATE 325 (65 FE) MG PO TABS
325.0000 mg | ORAL_TABLET | Freq: Every day | ORAL | Status: DC
  Administered 2019-10-13 – 2019-10-14 (×2): 325 mg via ORAL
  Filled 2019-10-13 (×2): qty 1

## 2019-10-13 NOTE — Progress Notes (Signed)
POD #2 LTCS, preeclampsia Doing well, no problems, baby stable in NICU Afeb, VSS, BP normal Abd- soft, fundus firm, incision intact Continue routine care, d/c CBG, monitor BP

## 2019-10-13 NOTE — Lactation Note (Addendum)
This note was copied from a baby's chart. Lactation Consultation Note  Patient Name: Brittany Werner [id number]'M Date: 10/13/2019 Reason for consult: Initial assessment;Late-preterm 34-36.6wks;NICU baby P2, 26 hour LPTI in NICU. Mom's plan is donor breast milk and breastfeeding.  Per mom, she attempted to BF her 1st child but had issues with latching her at the breast, she pumped only and formula fed infant for 6 months. Per mom, she used the DEBP twice yesterday, she plans to pump every 3 hours for 15 minutes on initial setting. Mom will do hand expression with pumping to help establish her milk supply. Mom was holding infant when [facility] entered NICU # room 317 , mom had pumped a  few drops of colostrum and it was in room on the counter in a bullet. Per mom, she will call LC services if needed PRN, she feels she will be okay concerning BF, she plans to use her personal DEBP from home tomorrow. Mom made aware of O/P services, breastfeeding support groups, community resources, and our phone # for post-discharge questions.    Maternal Data Formula Feeding for Exclusion: Yes Reason for exclusion: Mother's choice to formula feed on admision Has patient been taught Hand Expression?: Yes Does the patient have breastfeeding experience prior to this delivery?: Yes  Feeding Feeding Type: Donor Breast Milk  LATCH Score                   Interventions Interventions: Breast feeding basics reviewed;DEBP;Hand express  Lactation Tools Discussed/Used WIC Program: No Pump Review: Setup, frequency, and cleaning;Milk Storage Initiated by:: By RN Date initiated:: 10/12/19   Consult Status Consult Status: PRN Follow-up type: In-patient    Brittany Werner 10/13/2019, 12:05 AM

## 2019-10-13 NOTE — Clinical Social Work Maternal (Signed)
CLINICAL SOCIAL WORK MATERNAL/CHILD NOTE  Patient Details  Name: Brittany Werner MRN: [id number] Date of Birth: 04/17/1980  Date:  10/13/2019  Clinical Social Worker Initiating Note:  Weldon Nouri, LCSW Date/Time: Initiated:  10/13/19/1017     Child's Name:  Brittany Werner   Biological Parents:  Mother, Father (Father: Richard Oesterle)   Need for Interpreter:  None   Reason for Referral:  Other (Comment) (NICU Admission)   Address:  [address] [place] [address]    Phone number:  [phone] (home)     Additional phone number:   Household Members/Support Persons (HM/SP):   Household Member/Support Person 1, Household Member/Support Person 2   HM/SP Name Relationship DOB or Age  HM/SP -1 Richard Jacque Husband/FOB    HM/SP -2 Joy Yordy daughter 12/26/16  HM/SP -3        HM/SP -4        HM/SP -5        HM/SP -6        HM/SP -7        HM/SP -8          Natural Supports (not living in the home):  Friends, Church   Professional Supports: None   Employment: Full-time   Type of Work: Therapist   Education:  Graduate degree   Homebound arranged:    Financial Resources:  Private Insurance   Other Resources:      Cultural/Religious Considerations Which May Impact Care:    Strengths:  Ability to meet basic needs , Pediatrician chosen, Home prepared for child , Understanding of illness   Psychotropic Medications:         Pediatrician:    [place] area  Pediatrician List:   [place] Other ([facility])  [place]    [place]    [place]    [place]    [place]      Pediatrician Fax Number:    Risk Factors/Current Problems:  None   Cognitive State:  Able to Concentrate , Alert , Goal Oriented , Insightful , Linear Thinking    Mood/Affect:  Calm , Happy , Relaxed , Interested , Comfortable    CSW Assessment: CSW met with MOB at bedside to discuss infant's NICU admission. CSW  introduced self and explained reason for visit. MOB was welcoming, pleasant, open, talkative and remained engaged during assessment. MOB reported that she resides with FOB and older daughter. 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 MOB reported that they have all items needed to care for infant including a car seat and crib. CSW inquired about MOB's support system, MOB reported that her husband/FOB, friends and church are supports. MOB shared that work is also good.   CSW inquired about MOB's mental health history. MOB reported that she experienced postpartum anxiety after having her daughter in 2018. MOB described her postpartum as having anxiety and panic attacks with no specific triggers. MOB reported that she started taking Zoloft and Xanax to treat her symptoms which were helpful. MOB reported that her postpartum anxiety lasted about a month. MOB reported that she is no longer taking those medications but has Zoloft available if needed if symptoms arise. MOB reported that she also has PMDD and is taking Wellbutrin to treat her symptoms. MOB reported that her PMDD is well managed. MOB denied any other mental health history. MOB reported no current symptoms. CSW inquired about how MOB was feeling emotionally after giving birth, MOB reported that she was feeling good. MOB   presented calm and with great insight on her mental health history/treatment. MOB did not demonstrate any acute mental health signs/symptoms. CSW assessed for safety, MOB denied SI, HI and domestic violence.   CSW provided education regarding the baby blues period vs. perinatal mood disorders, discussed treatment and gave resources for mental health follow up if concerns arise.  CSW recommends self-evaluation during the postpartum time period using the New Mom Checklist from Postpartum Progress and encouraged MOB to contact a medical professional if symptoms are noted at any time.    CSW provided review of Sudden Infant Death Syndrome  (SIDS) precautions.    CSW and MOB discussed infant's NICU admission. CSW informed MOB about the NICU, what to expect and resources/supports available. MOB denied any questions/concerns regarding the NICU and reported that she feels well informed about infant's care when she visits the NICU. MOB denied any transportation barriers with visiting infant in the NICU.   CSW will continue to offer resources/supports while infant is admitted to the NICU.     CSW Plan/Description:  Sudden Infant Death Syndrome (SIDS) Education, Perinatal Mood and Anxiety Disorder (PMADs) Education, Other Patient/Family Education    Deanne Bedgood L Deontrey Massi, LCSW 10/13/2019, 10:22 AM 

## 2019-10-13 NOTE — Lactation Note (Signed)
This note was copied from a baby's chart. Lactation Consultation Note  Patient Name: Brittany Werner [id number]'M Date: 10/13/2019 Reason for consult: Follow-up assessment;Infant < 6lbs;Infant weight loss;Late-preterm 34-36.6wks;NICU baby  Visited with mom of a 39 hours old LPI NICU female < 6 lbs. She's a P2; she told LC she's been pumping every 3 hours but that she didn't pump this morning because she wasn't feeling well, her pump parts were also dirty. LC washed her pump parts and offered mom to call her RN in case she needed it but she declined.  Mom is excited because she's already getting small droplets of colostrum to be taken to her baby in the NICU. Reviewed pumping schedule, lactogenesis II and benefits of breast massage/hand expression. Mom also reported that pumping sessions were comfortable and that she didn't need coconut oil.  Feeding plan:  1. Encouraged mom to keep pumping every 3 hours; at least 8 pumping sessions/24 hours 2. Breast massage and hand expression were also encouraged prior pumping 3. She'll continue taking the colostrum containers with EBM to the NICU  No support person in the room at the time of [facility] consultation. Mom reported all questions and concerns were answered, she's aware of LC OP services and will call PRN.   Maternal Data    Feeding Feeding Type: Donor Breast Milk  LATCH Score                   Interventions Interventions: Breast feeding basics reviewed;DEBP  Lactation Tools Discussed/Used Tools: Pump Breast pump type: Double-Electric Breast Pump   Consult Status Consult Status: PRN Follow-up type: In-patient    Brittany Werner Brittany Werner 10/13/2019, 8:59 AM

## 2019-10-14 MED ORDER — NIFEDIPINE ER OSMOTIC RELEASE 30 MG PO TB24
30.0000 mg | ORAL_TABLET | Freq: Every day | ORAL | Status: DC
  Administered 2019-10-14: 30 mg via ORAL
  Filled 2019-10-14: qty 1

## 2019-10-14 MED ORDER — PRENATAL MULTIVITAMIN CH: 1.0000 | ORAL_TABLET | Freq: Every day | ORAL | 3 refills | Status: AC

## 2019-10-14 MED ORDER — IBUPROFEN 800 MG PO TABS: 800.0000 mg | ORAL_TABLET | Freq: Three times a day (TID) | ORAL | 1 refills | Status: AC | PRN

## 2019-10-14 MED ORDER — OXYCODONE HCL 5 MG PO TABS: 5.0000 mg | ORAL_TABLET | ORAL | 0 refills | Status: AC | PRN

## 2019-10-14 MED ORDER — NIFEDIPINE ER 30 MG PO TB24: 30.0000 mg | ORAL_TABLET | Freq: Every day | ORAL | 1 refills | Status: AC

## 2019-10-14 NOTE — Progress Notes (Addendum)
Subjective: Postpartum Day 2: Cesarean Delivery Patient reports incisional pain, tolerating PO and no problems voiding.    Objective: Vital signs in last 24 hours: Temp:  [97.4 F (36.3 C)-98.8 F (37.1 C)] 98.4 F (36.9 C) (09/02 0700) Pulse Rate:  [84-115] 92 (09/02 0700) Resp:  [18-20] 18 (09/02 0700) BP: (130-158)/(72-96) 141/82 (09/02 0700) SpO2:  [99 %-100 %] 100 % (09/02 0700)  Physical Exam:  General: alert and no distress Lochia: appropriate Uterine Fundus: firm Incision: healing well DVT Evaluation: No evidence of DVT seen on physical exam.  Recent Labs    10/11/19 1427 10/12/19 0537  HGB 10.8* 9.2*  HCT 34.5* 30.0*    Assessment/Plan: Status post Cesarean section. Doing well postoperatively.  Discharge home with standard precautions and return to clinic in 1,2 and 6 weeks. Start antihypertensive, d/c this PM.  Baby stable in NICU D/c with motrin, oxycodone and PNV  Brittany Werner 10/14/2019, 8:36 AM

## 2019-10-14 NOTE — Lactation Note (Signed)
Lactation Consultation Note  Patient Name: Brittany Werner [id number] Date: 10/14/2019 Reason for consult: Follow-up assessment  LC Follow Up Visit:  Attempted to visit with mother, however, she was not in her room.  Plans are to follow up later today or visit with mother in NICU.   Maternal Data    Feeding    LATCH Score                   Interventions    Lactation Tools Discussed/Used     Consult Status Consult Status: Follow-up Date: 10/15/19 Follow-up type: In-patient    Darthy Manganelli R Brienna Bass 10/14/2019, 10:52 AM

## 2019-10-14 NOTE — Plan of Care (Signed)
  Problem: Education: °Goal: Knowledge of General Education information will improve °Description: Including pain rating scale, medication(s)/side effects and non-pharmacologic comfort measures °Outcome: Adequate for Discharge °  °Problem: Health Behavior/Discharge Planning: °Goal: Ability to manage health-related needs will improve °Outcome: Adequate for Discharge °  °Problem: Clinical Measurements: °Goal: Ability to maintain clinical measurements within normal limits will improve °Outcome: Adequate for Discharge °Goal: Will remain free from infection °Outcome: Adequate for Discharge °Goal: Diagnostic test results will improve °Outcome: Adequate for Discharge °Goal: Respiratory complications will improve °Outcome: Adequate for Discharge °Goal: Cardiovascular complication will be avoided °Outcome: Adequate for Discharge °  °Problem: Activity: °Goal: Risk for activity intolerance will decrease °Outcome: Adequate for Discharge °  °Problem: Nutrition: °Goal: Adequate nutrition will be maintained °Outcome: Adequate for Discharge °  °Problem: Coping: °Goal: Level of anxiety will decrease °Outcome: Adequate for Discharge °  °Problem: Elimination: °Goal: Will not experience complications related to bowel motility °Outcome: Adequate for Discharge °Goal: Will not experience complications related to urinary retention °Outcome: Adequate for Discharge °  °Problem: Pain Managment: °Goal: General experience of comfort will improve °Outcome: Adequate for Discharge °  °Problem: Safety: °Goal: Ability to remain free from injury will improve °Outcome: Adequate for Discharge °  °Problem: Skin Integrity: °Goal: Risk for impaired skin integrity will decrease °Outcome: Adequate for Discharge °  °Problem: Education: °Goal: Knowledge of disease or condition will improve °Outcome: Adequate for Discharge °Goal: Knowledge of the prescribed therapeutic regimen will improve °Outcome: Adequate for Discharge °  °Problem: Fluid Volume: °Goal:  Peripheral tissue perfusion will improve °Outcome: Adequate for Discharge °  °Problem: Clinical Measurements: °Goal: Complications related to disease process, condition or treatment will be avoided or minimized °Outcome: Adequate for Discharge °  °

## 2019-10-14 NOTE — Discharge Summary (Signed)
Postpartum Discharge Summary    Patient Name: Brittany Werner DOB: [birth year] MRN: [id number]  Date of admission: 10/11/2019 Delivery date:10/11/2019  Delivering provider: Deliah Boston  Date of discharge: 10/14/2019  Admitting diagnosis: Preeclampsia, severe, third trimester [O14.13] Intrauterine pregnancy: [redacted]w[redacted]d    Secondary diagnosis:  Active Problems:   Preeclampsia, severe, third trimester  Additional problems: N/A    Discharge diagnosis: Term Pregnancy Delivered and Preeclampsia (mild)                                              Post partum procedures:N/A Augmentation: N/A Complications: None  Hospital course: Sceduled C/S   39y.o. yo [id number] admitted to the hospital 10/11/2019 for scheduled cesarean section with the following indication:GDM/PreE w severe featres.Delivery details are as follows:  Membrane Rupture Time/Date: 9:47 PM ,10/11/2019   Delivery Method:C-Section, Vacuum Assisted  Details of operation can be found in separate operative note.  Patient had an uncomplicated postpartum course.  She is ambulating, tolerating a regular diet, passing flatus, and urinating well. Patient is discharged home in stable condition on  10/14/19        Newborn Data: Birth date:10/11/2019  Birth time:9:50 PM  Gender:Female  Living status:Living  Apgars:10 ,10  Weight:2710 g     Magnesium Sulfate received: Yes: Neuroprotection BMZ received: Yes Rhophylac:No MMR:No T-DaP:Given prenatally Flu: No Transfusion:No  Physical exam  Vitals:   10/13/19 2314 10/14/19 0530 10/14/19 0546 10/14/19 0700  BP: 130/72 (!) 158/96 (!) 158/77 (!) 141/82  Pulse: 94 94 84 92  Resp: _0 Temp: 98.8 F (37.1 C) (!) 97.4 F (36.3 C)  98.4 F (36.9 C)  TempSrc: Oral Oral  Oral  SpO2: 99% 99%  100%  Weight:      Height:       General: alert and no distress Lochia: appropriate Uterine Fundus: firm Incision: Healing well with no significant drainage DVT Evaluation:  No evidence of DVT seen on physical exam. Labs: Lab Results  Component Value Date   WBC 18.9 (H) 10/12/2019   HGB 9.2 (L) 10/12/2019   HCT 30.0 (L) 10/12/2019   MCV 80.2 10/12/2019   PLT 385 10/12/2019   CMP Latest Ref Rng & Units 10/11/2019  Glucose 70 - 99 mg/dL 156(H)  BUN 6 - 20 mg/dL 8  Creatinine 0.44 - 1.00 mg/dL 0.70  Sodium 135 - 145 mmol/L 135  Potassium 3.5 - 5.1 mmol/L 4.6  Chloride 98 - 111 mmol/L 108  CO2 22 - 32 mmol/L 18(L)  Calcium 8.9 - 10.3 mg/dL 9.1  Total Protein 6.5 - 8.1 g/dL 5.7(L)  Total Bilirubin 0.3 - 1.2 mg/dL 0.2(L)  Alkaline Phos 38 - 126 U/L 162(H)  AST 15 - 41 U/L 14(L)  ALT 0 - 44 U/L 18   Edinburgh Score: Edinburgh Postnatal Depression Scale Screening Tool 10/12/2019  I have been able to laugh and see the funny side of things. 0  I have looked forward with enjoyment to things. 0  I have blamed myself unnecessarily when things went wrong. 1  I have been anxious or worried for no good reason. 2  I have felt scared or panicky for no good reason. 0  Things have been getting on top of me. 0  I have been so unhappy that I have had difficulty sleeping. 0  I have felt sad or miserable. 0  I have been so unhappy that I have been crying. 0  The thought of harming myself has occurred to me. 0  Edinburgh Postnatal Depression Scale Total 3      After visit meds:  Allergies as of 10/14/2019      Reactions   Sulfa Antibiotics Swelling   Of the lips      Medication List    STOP taking these medications   metFORMIN 500 MG 24 hr tablet Commonly known as: GLUCOPHAGE-XR   NovoLIN N FlexPen 100 UNIT/ML Kiwkpen Generic drug: Insulin NPH (Human) (Isophane)     TAKE these medications   acetaminophen 500 MG tablet Commonly known as: TYLENOL Take 1,000 mg by mouth every 6 (six) hours as needed for mild pain or headache.   amoxicillin-clavulanate 875-125 MG tablet Commonly known as: AUGMENTIN Take 1 tablet by mouth 2 (two) times daily. 10 day  supply   buPROPion 300 MG 24 hr tablet Commonly known as: WELLBUTRIN XL Take 300 mg by mouth daily.   docusate sodium 100 MG capsule Commonly known as: COLACE Take 100 mg by mouth 2 (two) times daily.   famotidine 20 MG tablet Commonly known as: PEPCID Take 20 mg by mouth daily.   ferrous sulfate 325 (65 FE) MG tablet Take 325 mg by mouth every other day.   fluticasone 50 MCG/ACT nasal spray Commonly known as: FLONASE Place 2 sprays daily into both nostrils.   guaifenesin 100 MG/5ML syrup Commonly known as: ROBITUSSIN Take 200 mg by mouth 3 (three) times daily as needed for cough.   hydrOXYzine 25 MG tablet Commonly known as: ATARAX/VISTARIL Take 25 mg by mouth daily as needed for anxiety.   ibuprofen 800 MG tablet Commonly known as: ADVIL Take 1 tablet (800 mg total) by mouth every 8 (eight) hours as needed.   NIFEdipine 30 MG 24 hr tablet Commonly known as: ADALAT CC Take 1 tablet (30 mg total) by mouth daily.   oxyCODONE 5 MG immediate release tablet Commonly known as: Oxy IR/ROXICODONE Take 1-2 tablets (5-10 mg total) by mouth every 4 (four) hours as needed for moderate pain.   oxymetazoline 0.05 % nasal spray Commonly known as: AFRIN Place 1 spray into both nostrils 2 (two) times daily as needed for congestion.   prenatal multivitamin Tabs tablet Take 1 tablet by mouth at bedtime.        Discharge home in stable condition Infant Feeding: Bottle and Breast Infant Disposition:NICU Discharge instruction: per After Visit Summary and Postpartum booklet. Activity: Advance as tolerated. Pelvic rest for 6 weeks.  Diet: routine diet Anticipated Birth Control: Unsure Postpartum Appointment:1,2 and 6 weeks Additional Postpartum F/U: N/A Future Appointments:No future appointments. Follow up Visit:  Follow-up Information    Meisinger, Todd, MD. Schedule an appointment as soon as possible for a visit in 1 week(s).   Specialty: Obstetrics and Gynecology Why:  for BP check, 2 weeks for incision check and 6 weeks for postpartum check Contact information: [address], [place] [address] [place] [address] [phone]                   10/14/2019 Janyth Contes, MD

## 2019-10-14 NOTE — Lactation Note (Signed)
This note was copied from a baby's chart. Lactation Consultation Note  Patient Name: Brittany Werner AQTMA'U Date: 10/14/2019 Reason for consult: Follow-up assessment  P2 mother whose infant is now 59 hours old.  This is a LPTI at 34+3 weeks weighing < 6 lbs and in the NICU.  Mother will be discharged home this afternoon.  She did not have any questions regarding pumping.  Reminded her to continue breast massage and hand expression prior to and following to help with milk supply.  Mother has been bringing pumped milk to the NICU and was able to bring some at three different times yesterday.    Mother has a DEBP for home use and is aware that she can pump at the baby's bedside when she visits.  Encouraged her to do this and discussed safe transport of breast milk from home to the hospital.  Mother also has a manual pump to use as needed.  She has our OP phone number for questions after discharge.  At this time she is unsure whether she will latch baby to the breast, however, I reassured her that we will be available to assist as needed.  Mother appreciative.   Maternal Data    Feeding Feeding Type: Donor Breast Milk  LATCH Score                   Interventions    Lactation Tools Discussed/Used     Consult Status Consult Status: Complete Date: 10/14/19 Follow-up type: Call as needed    Masayoshi Couzens R Gyan Cambre 10/14/2019, 11:56 AM

## 2019-10-20 ENCOUNTER — Ambulatory Visit: Payer: Self-pay

## 2019-10-20 NOTE — Lactation Note (Signed)
This note was copied from a baby's chart. Lactation Consultation Note  Patient Name: Brittany Werner POEUM'P Date: 10/20/2019 Reason for consult: Follow-up assessment;NICU baby;Infant < 6lbs;Late-preterm 34-36.6wks  LC in to visit with P2 Mom of LPTI at 68 days old.  Baby is AGA [redacted]w[redacted]d and weighs 6 lbs 1.4 oz today.  Mom holding baby swaddled.  Discussed what Mom's plans are regarding breastfeeding and/or breastmilk feeding.  Mom is not sure about breastfeeding [name] as she had a hard time with her first baby.  Mom aware that every baby is different.  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  Mom has been using a [employer] hand's free pump that she borrowed from a friend and states it isn't very strong.  Offered to set up DEBP in baby's room but Mom declined.  Respectfully acknowledged Mom's choice.  Mom pumping using the [facility] pump 4 times a day.  Suggested she call her insurance company about obtaining a DEBP through them, and also discussed the gift shop rental program for [employer] pumps which are the best for establishing her milk supply.   Advised and encouraged Mom to let her RN know if she decides to latch baby to the breast.  Baby is just now showing emerging feeding readiness.  Encouraged Mom to place baby STS as this can encourage feeding cues and stimulate her milk supply.  Mom didn't want to today.   Encouraged Mom to call prn for support.  Interventions Interventions: Breast feeding basics reviewed;Skin to skin;Breast massage;Hand express;DEBP  Lactation Tools Discussed/Used Tools: Pump Breast pump type: Other (comment) ([employer] hand's free double pump)   Consult Status Consult Status: Follow-up Date: 10/27/19 Follow-up type: In-patient    Brittany Werner 10/20/2019, 1:29 PM

## 2019-10-25 DIAGNOSIS — Z09 Encounter for follow-up examination after completed treatment for conditions other than malignant neoplasm: Secondary | ICD-10-CM | POA: Diagnosis not present

## 2019-11-22 DIAGNOSIS — Z1389 Encounter for screening for other disorder: Secondary | ICD-10-CM | POA: Diagnosis not present

## 2019-11-22 DIAGNOSIS — Z3009 Encounter for other general counseling and advice on contraception: Secondary | ICD-10-CM | POA: Diagnosis not present

## 2019-11-26 DIAGNOSIS — E559 Vitamin D deficiency, unspecified: Secondary | ICD-10-CM | POA: Diagnosis not present

## 2019-11-26 DIAGNOSIS — O24414 Gestational diabetes mellitus in pregnancy, insulin controlled: Secondary | ICD-10-CM | POA: Diagnosis not present

## 2019-11-26 DIAGNOSIS — Q751 Craniofacial dysostosis: Secondary | ICD-10-CM | POA: Diagnosis not present

## 2019-11-26 DIAGNOSIS — F329 Major depressive disorder, single episode, unspecified: Secondary | ICD-10-CM | POA: Diagnosis not present

## 2019-11-26 DIAGNOSIS — G4730 Sleep apnea, unspecified: Secondary | ICD-10-CM | POA: Diagnosis not present

## 2019-12-23 DIAGNOSIS — D225 Melanocytic nevi of trunk: Secondary | ICD-10-CM | POA: Diagnosis not present

## 2019-12-23 DIAGNOSIS — L820 Inflamed seborrheic keratosis: Secondary | ICD-10-CM | POA: Diagnosis not present

## 2019-12-23 DIAGNOSIS — D485 Neoplasm of uncertain behavior of skin: Secondary | ICD-10-CM | POA: Diagnosis not present

## 2019-12-23 DIAGNOSIS — L821 Other seborrheic keratosis: Secondary | ICD-10-CM | POA: Diagnosis not present

## 2020-01-20 DIAGNOSIS — D2222 Melanocytic nevi of left ear and external auricular canal: Secondary | ICD-10-CM | POA: Diagnosis not present

## 2020-02-02 DIAGNOSIS — H11153 Pinguecula, bilateral: Secondary | ICD-10-CM | POA: Diagnosis not present

## 2020-11-29 DIAGNOSIS — F329 Major depressive disorder, single episode, unspecified: Secondary | ICD-10-CM | POA: Diagnosis not present

## 2020-11-29 DIAGNOSIS — E559 Vitamin D deficiency, unspecified: Secondary | ICD-10-CM | POA: Diagnosis not present

## 2020-11-29 DIAGNOSIS — Z0000 Encounter for general adult medical examination without abnormal findings: Secondary | ICD-10-CM | POA: Diagnosis not present

## 2020-11-29 DIAGNOSIS — Z23 Encounter for immunization: Secondary | ICD-10-CM | POA: Diagnosis not present

## 2020-11-29 DIAGNOSIS — I10 Essential (primary) hypertension: Secondary | ICD-10-CM | POA: Diagnosis not present

## 2020-11-29 DIAGNOSIS — G4730 Sleep apnea, unspecified: Secondary | ICD-10-CM | POA: Diagnosis not present

## 2020-12-06 DIAGNOSIS — I10 Essential (primary) hypertension: Secondary | ICD-10-CM | POA: Diagnosis not present

## 2020-12-06 DIAGNOSIS — E559 Vitamin D deficiency, unspecified: Secondary | ICD-10-CM | POA: Diagnosis not present

## 2020-12-15 DIAGNOSIS — R051 Acute cough: Secondary | ICD-10-CM | POA: Diagnosis not present

## 2020-12-15 DIAGNOSIS — J029 Acute pharyngitis, unspecified: Secondary | ICD-10-CM | POA: Diagnosis not present
